# Patient Record
Sex: Female | Born: 1975
Health system: Southern US, Community
[De-identification: ages and names within clinical notes are randomized; demographics above are authoritative.]

## PROBLEM LIST (undated history)

## (undated) DIAGNOSIS — J45909 Unspecified asthma, uncomplicated: Secondary | ICD-10-CM

## (undated) DIAGNOSIS — J40 Bronchitis, not specified as acute or chronic: Secondary | ICD-10-CM

## (undated) DIAGNOSIS — J302 Other seasonal allergic rhinitis: Secondary | ICD-10-CM

## (undated) DIAGNOSIS — K219 Gastro-esophageal reflux disease without esophagitis: Secondary | ICD-10-CM

## (undated) DIAGNOSIS — J069 Acute upper respiratory infection, unspecified: Secondary | ICD-10-CM

## (undated) DIAGNOSIS — R87619 Unspecified abnormal cytological findings in specimens from cervix uteri: Secondary | ICD-10-CM

## (undated) HISTORY — DX: Gastro-esophageal reflux disease without esophagitis: K21.9

## (undated) HISTORY — DX: Unspecified abnormal cytological findings in specimens from cervix uteri: R87.619

## (undated) HISTORY — DX: Other seasonal allergic rhinitis: J30.2

## (undated) HISTORY — PX: WISDOM TOOTH EXTRACTION: SHX21

## (undated) HISTORY — DX: Acute upper respiratory infection, unspecified: J06.9

## (undated) HISTORY — DX: Unspecified asthma, uncomplicated: J45.909

---

## 1994-09-06 HISTORY — PX: BREAST REDUCTION SURGERY: SHX8

## 2003-06-14 ENCOUNTER — Other Ambulatory Visit: Admission: RE | Admit: 2003-06-14 | Discharge: 2003-06-14 | Payer: Self-pay | Admitting: *Deleted

## 2003-06-14 ENCOUNTER — Other Ambulatory Visit: Admission: RE | Admit: 2003-06-14 | Discharge: 2003-06-14 | Payer: Self-pay | Admitting: Obstetrics & Gynecology

## 2006-12-19 DIAGNOSIS — M549 Dorsalgia, unspecified: Secondary | ICD-10-CM | POA: Insufficient documentation

## 2007-01-23 DIAGNOSIS — R070 Pain in throat: Secondary | ICD-10-CM | POA: Insufficient documentation

## 2008-08-23 DIAGNOSIS — J020 Streptococcal pharyngitis: Secondary | ICD-10-CM | POA: Insufficient documentation

## 2008-08-23 DIAGNOSIS — B359 Dermatophytosis, unspecified: Secondary | ICD-10-CM | POA: Insufficient documentation

## 2008-08-23 DIAGNOSIS — Z9889 Other specified postprocedural states: Secondary | ICD-10-CM | POA: Insufficient documentation

## 2008-08-23 HISTORY — DX: Other specified postprocedural states: Z98.890

## 2013-02-10 DIAGNOSIS — R509 Fever, unspecified: Secondary | ICD-10-CM | POA: Insufficient documentation

## 2013-02-10 DIAGNOSIS — J029 Acute pharyngitis, unspecified: Secondary | ICD-10-CM | POA: Insufficient documentation

## 2013-06-13 DIAGNOSIS — R42 Dizziness and giddiness: Secondary | ICD-10-CM | POA: Insufficient documentation

## 2019-08-29 DIAGNOSIS — Z20828 Contact with and (suspected) exposure to other viral communicable diseases: Secondary | ICD-10-CM | POA: Diagnosis not present

## 2019-08-29 DIAGNOSIS — Z03818 Encounter for observation for suspected exposure to other biological agents ruled out: Secondary | ICD-10-CM | POA: Diagnosis not present

## 2019-09-05 DIAGNOSIS — J209 Acute bronchitis, unspecified: Secondary | ICD-10-CM | POA: Diagnosis not present

## 2019-09-05 DIAGNOSIS — R05 Cough: Secondary | ICD-10-CM | POA: Diagnosis not present

## 2019-09-05 DIAGNOSIS — Z20828 Contact with and (suspected) exposure to other viral communicable diseases: Secondary | ICD-10-CM | POA: Diagnosis not present

## 2019-09-29 DIAGNOSIS — Z20828 Contact with and (suspected) exposure to other viral communicable diseases: Secondary | ICD-10-CM | POA: Diagnosis not present

## 2019-10-12 ENCOUNTER — Encounter (HOSPITAL_BASED_OUTPATIENT_CLINIC_OR_DEPARTMENT_OTHER): Payer: Self-pay | Admitting: *Deleted

## 2019-10-12 ENCOUNTER — Emergency Department (HOSPITAL_BASED_OUTPATIENT_CLINIC_OR_DEPARTMENT_OTHER): Payer: BC Managed Care – PPO

## 2019-10-12 ENCOUNTER — Other Ambulatory Visit: Payer: Self-pay

## 2019-10-12 ENCOUNTER — Emergency Department (HOSPITAL_BASED_OUTPATIENT_CLINIC_OR_DEPARTMENT_OTHER)
Admission: EM | Admit: 2019-10-12 | Discharge: 2019-10-12 | Disposition: A | Discharge status: Home / Self Care | Payer: BC Managed Care – PPO | Attending: Emergency Medicine | Admitting: Emergency Medicine

## 2019-10-12 DIAGNOSIS — R05 Cough: Secondary | ICD-10-CM | POA: Diagnosis not present

## 2019-10-12 DIAGNOSIS — R0602 Shortness of breath: Secondary | ICD-10-CM | POA: Diagnosis not present

## 2019-10-12 DIAGNOSIS — Z87891 Personal history of nicotine dependence: Secondary | ICD-10-CM | POA: Diagnosis not present

## 2019-10-12 DIAGNOSIS — U071 COVID-19: Secondary | ICD-10-CM | POA: Insufficient documentation

## 2019-10-12 DIAGNOSIS — Z8616 Personal history of COVID-19: Secondary | ICD-10-CM | POA: Diagnosis not present

## 2019-10-12 DIAGNOSIS — R059 Cough, unspecified: Secondary | ICD-10-CM

## 2019-10-12 HISTORY — DX: Bronchitis, not specified as acute or chronic: J40

## 2019-10-12 MED ORDER — BENZONATATE 100 MG PO CAPS
200.0000 mg | ORAL_CAPSULE | Freq: Once | ORAL | Status: AC
  Administered 2019-10-12: 200 mg via ORAL
  Filled 2019-10-12: qty 2

## 2019-10-12 MED ORDER — BENZONATATE 100 MG PO CAPS: 100.0000 mg | ORAL_CAPSULE | Freq: Three times a day (TID) | ORAL | 0 refills | Status: DC

## 2019-10-12 NOTE — ED Provider Notes (Signed)
[place] EMERGENCY DEPARTMENT Provider Note   CSN: [id number] Arrival date & time: 10/12/19  [id number]     History Chief Complaint  Patient presents with  . Cough  . Shortness of Breath    Lindsay Mays is a 44 y.o. female.  44 yo F with chief complaints of cough.  Cough is gotten much more intense over the past 48 hours.  Patient was diagnosed with the novel coronavirus about 2 weeks ago.  Had been slightly improving and then had worsening cough.  Entire family was sick with this illness.  They are all feeling much better.  Patient had bronchitis a month before she was diagnosed with coronavirus.  Denies production of cough.  Denies leg swelling.  Denies vomiting or diarrhea.  Denies ongoing fevers.  Has had some chest pain with coughing.  The history is provided by the patient.  Illness Severity:  Moderate Onset quality:  Gradual Duration:  2 days Timing:  Constant Progression:  Worsening Chronicity:  New Associated symptoms: cough   Associated symptoms: no chest pain, no congestion, no fever, no headaches, no myalgias, no nausea, no rhinorrhea, no shortness of breath, no vomiting and no wheezing        Past Medical History:  Diagnosis Date  . Bronchitis     There are no problems to display for this patient.   History reviewed. No pertinent surgical history.   OB History    Gravida  4   Para  2   Term      Preterm      AB  2   Living        SAB      TAB      Ectopic      Multiple      Live Births              Family History  Problem Relation Age of Onset  . Cancer Father     Social History   Tobacco Use  . Smoking status: Former Research scientist (life sciences)  . Smokeless tobacco: Never Used  . Tobacco comment: quit 10 years ago  Substance Use Topics  . Alcohol use: Yes    Comment: occasionally  . Drug use: Never    Home Medications Prior to Admission medications   Medication Sig Start Date End Date Taking? Authorizing Provider    benzonatate (TESSALON) 100 MG capsule Take 1 capsule (100 mg total) by mouth every 8 (eight) hours. 10/12/19   Deno Etienne, DO    Allergies    Patient has no known allergies.  Review of Systems   Review of Systems  Constitutional: Negative for chills and fever.  HENT: Negative for congestion and rhinorrhea.   Eyes: Negative for redness and visual disturbance.  Respiratory: Positive for cough. Negative for shortness of breath and wheezing.   Cardiovascular: Negative for chest pain and palpitations.  Gastrointestinal: Negative for nausea and vomiting.  Genitourinary: Negative for dysuria and urgency.  Musculoskeletal: Negative for arthralgias and myalgias.  Skin: Negative for pallor and wound.  Neurological: Negative for dizziness and headaches.    Physical Exam Updated Vital Signs BP 123/87 (BP Location: Right Arm)   Pulse 77   Temp 98.1 F (36.7 C) (Oral)   Resp 18   Ht 5\' 5"  (1.651 m)   Wt 67.1 kg   SpO2 98%   BMI 24.63 kg/m   Physical Exam Vitals and nursing note reviewed.  Constitutional:      General: She is  not in acute distress.    Appearance: She is well-developed. She is not diaphoretic.  HENT:     Head: Normocephalic and atraumatic.  Eyes:     Pupils: Pupils are equal, round, and reactive to light.  Cardiovascular:     Rate and Rhythm: Normal rate and regular rhythm.     Heart sounds: No murmur. No friction rub. No gallop.   Pulmonary:     Effort: Pulmonary effort is normal. No tachypnea.     Breath sounds: No wheezing, rhonchi or rales.  Abdominal:     General: There is no distension.     Palpations: Abdomen is soft.     Tenderness: There is no abdominal tenderness.  Musculoskeletal:        General: No tenderness.     Cervical back: Normal range of motion and neck supple.  Skin:    General: Skin is warm and dry.  Neurological:     Mental Status: She is alert and oriented to person, place, and time.  Psychiatric:        Behavior: Behavior normal.      ED Results / Procedures / Treatments   Labs (all labs ordered are listed, but only abnormal results are displayed) Labs Reviewed - No data to display  EKG None  Radiology DG Chest [facility] 1 View  Result Date: 10/12/2019 CLINICAL DATA:  Cough and shortness of breath, recent history of COVID-19 EXAM: PORTABLE CHEST 1 VIEW COMPARISON:  None. FINDINGS: The heart size and mediastinal contours are within normal limits. Both lungs are clear. The visualized skeletal structures are unremarkable. IMPRESSION: No active disease. Electronically Signed   By: Randa Ngo M.D.   On: 10/12/2019 09:32    Procedures Procedures (including critical care time)  Medications Ordered in ED Medications  benzonatate (TESSALON) capsule 200 mg (200 mg Oral Given 10/12/19 [id number])    ED Course  I have reviewed the triage vital signs and the nursing notes.  Pertinent labs & imaging results that were available during my care of the patient were reviewed by me and considered in my medical decision making (see chart for details).    MDM Rules/Calculators/A&P                      44 yo F with a chief complaints of cough.  Occurring couple weeks after her initial diagnosis of the novel coronavirus.  She is well-appearing nontoxic.  Clear lung sounds on my exam.  She is able to ambulate without hypoxia.  Chest x-ray with out focal infiltrate or pneumothorax.  Will start on cough medicine.  PCP follow-up.  9:41 AM:  I have discussed the diagnosis/risks/treatment options with the patient and believe the pt to be eligible for discharge home to follow-up with PCP. We also discussed returning to the ED immediately if new or worsening sx occur. We discussed the sx which are most concerning (e.g., sudden worsening pain, fever, inability to tolerate by mouth) that necessitate immediate return. Medications administered to the patient during their visit and any new prescriptions provided to the patient are listed  below.  Medications given during this visit Medications  benzonatate (TESSALON) capsule 200 mg (200 mg Oral Given 10/12/19 0914)     The patient appears reasonably screen and/or stabilized for discharge and I doubt any other medical condition or other [facility] requiring further screening, evaluation, or treatment in the ED at this time prior to discharge.   Final Clinical Impression(s) / ED Diagnoses Final  diagnoses:  COVID-19  Cough    Rx / DC Orders ED Discharge Orders         Ordered    benzonatate (TESSALON) 100 MG capsule  Every 8 hours     10/12/19 [place], [place], DO 10/12/19 [phone]

## 2019-10-12 NOTE — Discharge Instructions (Signed)
Your CXR was without concern for bacterial pneumonia.    Take the cough medicine as prescribed.  Unfortunately cough is a reflex, the medicine will note stop it but hopefully will make it more tolerable.  Please return for worsening shortness of breath. Follow up with your family doctor.

## 2019-10-12 NOTE — ED Triage Notes (Signed)
Covid + on the 23rd of January.  Shortness of breath in the last 2 days. Denies fever.

## 2019-10-12 NOTE — ED Notes (Signed)
Ambulated in hall.  Pulse ox remained between 92-96%.  Tolerated well.

## 2019-10-27 DIAGNOSIS — J209 Acute bronchitis, unspecified: Secondary | ICD-10-CM | POA: Diagnosis not present

## 2019-10-27 DIAGNOSIS — R062 Wheezing: Secondary | ICD-10-CM | POA: Diagnosis not present

## 2019-11-28 DIAGNOSIS — R05 Cough: Secondary | ICD-10-CM | POA: Diagnosis not present

## 2019-12-20 DIAGNOSIS — K219 Gastro-esophageal reflux disease without esophagitis: Secondary | ICD-10-CM | POA: Diagnosis not present

## 2019-12-20 DIAGNOSIS — J4541 Moderate persistent asthma with (acute) exacerbation: Secondary | ICD-10-CM | POA: Diagnosis not present

## 2019-12-20 DIAGNOSIS — R05 Cough: Secondary | ICD-10-CM | POA: Diagnosis not present

## 2019-12-20 DIAGNOSIS — R053 Chronic cough: Secondary | ICD-10-CM | POA: Insufficient documentation

## 2019-12-20 DIAGNOSIS — R062 Wheezing: Secondary | ICD-10-CM | POA: Insufficient documentation

## 2019-12-20 DIAGNOSIS — J309 Allergic rhinitis, unspecified: Secondary | ICD-10-CM | POA: Diagnosis not present

## 2019-12-20 DIAGNOSIS — U099 Post covid-19 condition, unspecified: Secondary | ICD-10-CM

## 2019-12-20 DIAGNOSIS — Z87891 Personal history of nicotine dependence: Secondary | ICD-10-CM | POA: Diagnosis not present

## 2019-12-20 HISTORY — DX: Chronic cough: R05.3

## 2019-12-20 HISTORY — DX: Post covid-19 condition, unspecified: U09.9

## 2020-01-23 DIAGNOSIS — R05 Cough: Secondary | ICD-10-CM | POA: Diagnosis not present

## 2020-01-23 DIAGNOSIS — R062 Wheezing: Secondary | ICD-10-CM | POA: Diagnosis not present

## 2020-01-25 DIAGNOSIS — J019 Acute sinusitis, unspecified: Secondary | ICD-10-CM | POA: Diagnosis not present

## 2020-01-31 DIAGNOSIS — R05 Cough: Secondary | ICD-10-CM | POA: Diagnosis not present

## 2020-01-31 DIAGNOSIS — J4541 Moderate persistent asthma with (acute) exacerbation: Secondary | ICD-10-CM | POA: Diagnosis not present

## 2020-01-31 DIAGNOSIS — R062 Wheezing: Secondary | ICD-10-CM | POA: Diagnosis not present

## 2020-03-25 DIAGNOSIS — Z1231 Encounter for screening mammogram for malignant neoplasm of breast: Secondary | ICD-10-CM | POA: Diagnosis not present

## 2020-04-05 DIAGNOSIS — H6692 Otitis media, unspecified, left ear: Secondary | ICD-10-CM | POA: Diagnosis not present

## 2020-04-05 DIAGNOSIS — J019 Acute sinusitis, unspecified: Secondary | ICD-10-CM | POA: Diagnosis not present

## 2020-04-05 DIAGNOSIS — J029 Acute pharyngitis, unspecified: Secondary | ICD-10-CM | POA: Diagnosis not present

## 2020-05-13 ENCOUNTER — Other Ambulatory Visit: Payer: Self-pay

## 2020-05-13 ENCOUNTER — Encounter: Payer: Self-pay | Admitting: Allergy

## 2020-05-13 ENCOUNTER — Ambulatory Visit (INDEPENDENT_AMBULATORY_CARE_PROVIDER_SITE_OTHER): Payer: BC Managed Care – PPO | Admitting: Allergy

## 2020-05-13 VITALS — BP 110/70 | HR 80 | Temp 97.8°F | Resp 16 | Ht 65.0 in | Wt 154.1 lb

## 2020-05-13 DIAGNOSIS — H1013 Acute atopic conjunctivitis, bilateral: Secondary | ICD-10-CM

## 2020-05-13 DIAGNOSIS — J4540 Moderate persistent asthma, uncomplicated: Secondary | ICD-10-CM | POA: Diagnosis not present

## 2020-05-13 DIAGNOSIS — J3089 Other allergic rhinitis: Secondary | ICD-10-CM | POA: Diagnosis not present

## 2020-05-13 MED ORDER — BREO ELLIPTA 100-25 MCG/INH IN AEPB: 1.0000 | INHALATION_SPRAY | Freq: Every day | RESPIRATORY_TRACT | 5 refills | Status: DC

## 2020-05-13 MED ORDER — AZELASTINE-FLUTICASONE 137-50 MCG/ACT NA SUSP: 1.0000 | Freq: Two times a day (BID) | NASAL | 5 refills | Status: DC

## 2020-05-13 MED ORDER — MONTELUKAST SODIUM 10 MG PO TABS: 10.0000 mg | ORAL_TABLET | Freq: Every day | ORAL | 5 refills | Status: DC

## 2020-05-13 MED ORDER — ALBUTEROL SULFATE HFA 108 (90 BASE) MCG/ACT IN AERS: 2.0000 | INHALATION_SPRAY | RESPIRATORY_TRACT | 1 refills | Status: DC | PRN

## 2020-05-13 NOTE — Assessment & Plan Note (Signed)
   See assessment and plan as above for allergic rhinitis.  

## 2020-05-13 NOTE — Progress Notes (Signed)
New Patient Note  RE: Lindsay Mays MRN: [id number] DOB: March 01, 1976 Date of Office Visit: 05/13/2020  Referring provider: No ref. provider found Primary care provider: Patient, No Pcp Per  Chief Complaint: Asthma and Allergic Rhinitis   History of Present Illness: I had the pleasure of seeing Lindsay Mays for initial evaluation at the [facility] [place] [facility] [place] on 05/13/2020. She is a 44 y.o. female, who is self-referred here for the evaluation of asthma and allergic rhinitis.  Patient had COVID-19 in January 2021 and developed a cough afterwards. She saw pulmonology and was diagnosed with asthma.  She reports symptoms of chest tightness, shortness of breath, coughing, wheezing for <1 years. Current medications include Breo 124mcg 1 puff QD and albuterol prn which help. She reports not using aerochamber with inhalers. She tried the following inhalers: none. Main triggers are unknown. In the last month, frequency of symptoms: few times per week. Frequency of nocturnal symptoms: 0x/month. Frequency of SABA use: few times per week. Interference with physical activity: no. Sleep is undisturbed. In the last 12 months, emergency room visits/urgent care visits/doctor office visits or hospitalizations due to respiratory issues: 1 to ER. In the last 12 months, oral steroids courses: 4. Lifetime history of hospitalization for respiratory issues: no. Prior intubations: no. History of pneumonia: no. She was evaluated by pulmonologist in the past. Smoking exposure: no. Up to date with flu vaccine: yes. Up to date with COVID-19 vaccine: yes.  History of reflux: yes and takes Prilosec as needed.   10/12/2019 CXR: "IMPRESSION: No active disease."  She reports symptoms of nasal congestion, rhinorrhea, sneezing, itchy/watery eyes. Symptoms have been going on for 10 years but worse since May. The symptoms are present mainly during the spring and fall.  Other triggers include exposure to no. Anosmia: yes.  Headache: yes. She has used zyrtec and Flonase 2 sprays QD with minimal improvement in symptoms. Sinus infections: 1 and treated with Augmentin x 10 days and prednisone with some benefit. Previous work up includes: none. Previous ENT evaluation: no. Previous sinus imaging: no. History of nasal polyps: no. Last eye exam: 2021.  Assessment and Plan: Lindsay Mays is a 44 y.o. female with: Other allergic rhinitis Rhinoconjunctivitis symptoms for many years mainly in the spring and fall but now worsening since May.  Tried Zyrtec and Flonase with minimal benefit.  Treated with Augmentin and prednisone with some benefit.  No prior allergy/ENT evaluation.  Today's skin testing showed: Positive to grass pollen only.   Start environmental control measures as below.  May use over the counter antihistamines such as Zyrtec (cetirizine), Claritin (loratadine), Allegra (fexofenadine), or Xyzal (levocetirizine) daily as needed. May take twice a day if needed. Start Singulair (montelukast) 10mg  daily at night. Cautioned that in some children/adults can experience behavioral changes including hyperactivity, agitation, depression, sleep disturbances and suicidal ideations. These side effects are rare, but if you notice them you should notify me and discontinue Singulair (montelukast). Start dymista (fluticasone + azelastine nasal spray combination) 1 spray per nostril twice a day. This replaces Flonase (fluticasone) for now. If it's not covered let us know.   Nasal saline spray (i.e., Simply Saline) or nasal saline lavage (i.e., NeilMed) is recommended as needed and prior to medicated nasal sprays.  If no benefit with above regimen, will refer to ENT next.   Allergic conjunctivitis of both eyes  See assessment and plan as above for allergic rhinitis.  Moderate persistent asthma without complication Diagnosed with COVID-19 in January 2021 and  developed a persistent cough afterwards.  Evaluated by pulmonology and  diagnosed with asthma.  Currently on Breo 100 mcg 1 puff once a day and using albuterol few times per week with good benefit.  Patient takes Prilosec as needed for reflux.  Up-to-date with COVID-19 vaccination.  Today's spirometry showed some restriction. . Daily controller medication(s): continue Breo 116mcg 1 puff once a day and rinse mouth after each use.  . May use albuterol rescue inhaler 2 puffs every 4 to 6 hours as needed for shortness of breath, chest tightness, coughing, and wheezing. May use albuterol rescue inhaler 2 puffs 5 to 15 minutes prior to strenuous physical activities. Monitor frequency of use.   Return in about 2 months (around 07/13/2020).  Meds ordered this encounter  Medications  . montelukast (SINGULAIR) 10 MG tablet    Sig: Take 1 tablet (10 mg total) by mouth at bedtime.    Dispense:  30 tablet    Refill:  5  . Azelastine-Fluticasone 137-50 MCG/ACT SUSP    Sig: Place 1 spray into the nose in the morning and at bedtime.    Dispense:  23 g    Refill:  5  . albuterol (VENTOLIN HFA) 108 (90 Base) MCG/ACT inhaler    Sig: Inhale 2 puffs into the lungs every 4 (four) hours as needed for wheezing or shortness of breath.    Dispense:  1 each    Refill:  1  . fluticasone furoate-vilanterol (BREO ELLIPTA) 100-25 MCG/INH AEPB    Sig: Inhale 1 puff into the lungs daily. Rinse mouth after each use.    Dispense:  60 each    Refill:  5   Other allergy screening: Food allergy: no Medication allergy: no Hymenoptera allergy: no Urticaria: no Eczema:no History of recurrent infections suggestive of immunodeficency: no  Diagnostics: Spirometry:  Tracings reviewed. Her effort: Good reproducible efforts. FVC: 2.95L FEV1: 2.62L, 88% predicted FEV1/FVC ratio: 89% Interpretation: Spirometry consistent with possible restrictive disease.  Please see scanned spirometry results for details.  Skin Testing: Environmental allergy panel. Positive test to: grass.  Results  discussed with patient/family.  Airborne Adult Perc - 05/13/20 0933    Time Antigen Placed 0933    Allergen Manufacturer Lindsay Mays    Location Back    Number of Test 59    Panel 1 Select    1. Control-Buffer 50% Glycerol Negative    2. Control-Histamine 1 mg/ml 2+    3. Albumin saline Negative    4. Lily Negative    5. [place] Negative    6. Johnson Negative    7. Mondamin Blue Negative    8. Meadow Fescue Negative    9. Perennial Rye Negative    10. Sweet Vernal Negative    11. Timothy Negative    12. Cocklebur Negative    13. Burweed Marshelder Negative    14. Ragweed, short Negative    15. Ragweed, Giant Negative    16. Plantain,  English Negative    17. Lamb's Quarters Negative    18. Sheep Sorrell Negative    19. Rough Pigweed Negative    20. Marsh Elder, Rough Negative    21. Mugwort, Common Negative    22. Ash mix Negative    23. Birch mix Negative    24. Beech American Negative    25. Box, Elder Negative    26. Cedar, red Negative    27. Cottonwood, [place] Negative    28. Elm mix Negative    29. Hickory Negative  30. Maple mix Negative    31. Oak, [place] mix Negative    32. Pecan Pollen Negative    33. Pine mix Negative    34. Sycamore Eastern Negative    35. St. [place], Black Pollen Negative    36. Alternaria alternata Negative    37. Cladosporium Herbarum Negative    38. Aspergillus mix Negative    39. Penicillium mix Negative    40. Bipolaris sorokiniana (Helminthosporium) Negative    41. Drechslera spicifera (Curvularia) Negative    42. Mucor plumbeus Negative    43. Fusarium moniliforme Negative    44. Aureobasidium pullulans (pullulara) Negative    45. Rhizopus oryzae Negative    46. Botrytis cinera Negative    47. Epicoccum nigrum Negative    48. Phoma betae Negative    49. Candida Albicans Negative    50. Trichophyton mentagrophytes Negative    51. Mite, D Farinae  5,000 AU/ml Negative    52. Mite, D Pteronyssinus  5,000 AU/ml Negative    53.  Cat Hair 10,000 BAU/ml Negative    54.  Dog Epithelia Negative    55. Mixed Feathers Negative    56. Horse Epithelia Negative    57. Cockroach, German Negative    58. Mouse Negative    59. Tobacco Leaf Negative          Intradermal - 05/13/20 0948    Time Antigen Placed 1610    Allergen Manufacturer Lindsay Mays    Location Arm    Number of Test 15    Intradermal Select    Control Negative    Guatemala 2+    Johnson 2+    7 Grass Negative    Ragweed mix Negative    Weed mix Negative    Tree mix Negative    Mold 1 Negative    Mold 2 Negative    Mold 3 Negative    Mold 4 Negative    Cat Negative    Dog Negative    Cockroach Negative    Mite mix Negative           Past Medical History: Patient Active Problem List   Diagnosis Date Noted  . Moderate persistent asthma without complication 96/12/5407  . Other allergic rhinitis 05/13/2020  . Allergic conjunctivitis of both eyes 05/13/2020   Past Medical History:  Diagnosis Date  . Asthma   . Bronchitis   . Recurrent upper respiratory infection (URI)    Past Surgical History: Past Surgical History:  Procedure Laterality Date  . BREAST REDUCTION SURGERY  1996  . CESAREAN SECTION  2007   Medication List:  Current Outpatient Medications  Medication Sig Dispense Refill  . albuterol (VENTOLIN HFA) 108 (90 Base) MCG/ACT inhaler Inhale 2 puffs into the lungs every 4 (four) hours as needed for wheezing or shortness of breath. 1 each 1  . B Complex Vitamins (VITAMIN B COMPLEX PO) Take by mouth.    . benzonatate (TESSALON) 100 MG capsule Take 1 capsule (100 mg total) by mouth every 8 (eight) hours. 21 capsule 0  . fluticasone furoate-vilanterol (BREO ELLIPTA) 100-25 MCG/INH AEPB Inhale 1 puff into the lungs daily. Rinse mouth after each use. 60 each 5  . levonorgestrel (MIRENA) 20 MCG/24HR IUD by Intrauterine route.    Marland Kitchen omeprazole (PRILOSEC) 40 MG capsule Take 40 mg by mouth as needed.    Marland Kitchen VITAMIN D, CHOLECALCIFEROL, PO Take by  mouth daily.    . Azelastine-Fluticasone 137-50 MCG/ACT SUSP Place 1 spray into the nose  in the morning and at bedtime. 23 g 5  . montelukast (SINGULAIR) 10 MG tablet Take 1 tablet (10 mg total) by mouth at bedtime. 30 tablet 5   No current facility-administered medications for this visit.   Allergies: No Known Allergies Social History: Social History   Socioeconomic History  . Marital status: Married    Spouse name: Not on file  . Number of children: Not on file  . Years of education: Not on file  . Highest education level: Not on file  Occupational History  . Not on file  Tobacco Use  . Smoking status: Former Research scientist (life sciences)  . Smokeless tobacco: Never Used  . Tobacco comment: quit 10 years ago  Vaping Use  . Vaping Use: Never used  Substance and Sexual Activity  . Alcohol use: Yes    Comment: occasionally  . Drug use: Never  . Sexual activity: Yes  Other Topics Concern  . Not on file  Social History Narrative  . Not on file   Social Determinants of Health   Financial Resource Strain:   . Difficulty of Paying Living Expenses: Not on file  Food Insecurity:   . Worried About Charity fundraiser in the Last Year: Not on file  . Ran Out of Food in the Last Year: Not on file  Transportation Needs:   . Lack of Transportation (Medical): Not on file  . Lack of Transportation (Non-Medical): Not on file  Physical Activity:   . Days of Exercise per Week: Not on file  . Minutes of Exercise per Session: Not on file  Stress:   . Feeling of Stress : Not on file  Social Connections:   . Frequency of Communication with Friends and Family: Not on file  . Frequency of Social Gatherings with Friends and Family: Not on file  . Attends Religious Services: Not on file  . Active Member of Clubs or Organizations: Not on file  . Attends Archivist Meetings: Not on file  . Marital Status: Not on file   Lives in a house. Smoking: denies Occupation: Product manager HistoryFreight forwarder in the house: no Charity fundraiser in the family room: no Carpet in the bedroom: yes Heating: electric Cooling: central Pet: yes 1 dog x 10 yrs  Family History: Family History  Problem Relation Age of Onset  . Cancer Father   . Allergic rhinitis Neg Hx   . Angioedema Neg Hx   . Asthma Neg Hx   . Atopy Neg Hx   . Eczema Neg Hx   . Immunodeficiency Neg Hx   . Urticaria Neg Hx    Review of Systems  Constitutional: Negative for appetite change, chills, fever and unexpected weight change.  HENT: Positive for congestion, rhinorrhea and sneezing.   Eyes: Positive for itching.  Respiratory: Positive for cough. Negative for chest tightness, shortness of breath and wheezing.   Cardiovascular: Negative for chest pain.  Gastrointestinal: Negative for abdominal pain.  Genitourinary: Negative for difficulty urinating.  Skin: Negative for rash.  Allergic/Immunologic: Positive for environmental allergies.  Neurological: Negative for headaches.   Objective: BP 110/70   Pulse 80   Temp 97.8 F (36.6 C) (Oral)   Resp 16   Ht 5\' 5"  (1.651 m)   Wt 154 lb 1.6 oz (69.9 kg)   SpO2 100%   BMI 25.64 kg/m  Body mass index is 25.64 kg/m. Physical Exam Vitals and nursing note reviewed.  Constitutional:      Appearance: Normal  appearance. She is well-developed.  HENT:     Head: Normocephalic and atraumatic.     Right Ear: Tympanic membrane and external ear normal.     Left Ear: Tympanic membrane and external ear normal.     Nose: Congestion present.     Comments: Left side turbinate hypertrophy    Mouth/Throat:     Mouth: Mucous membranes are moist.     Pharynx: Oropharynx is clear.  Eyes:     Conjunctiva/sclera: Conjunctivae normal.  Cardiovascular:     Rate and Rhythm: Normal rate and regular rhythm.     Heart sounds: Normal heart sounds. No murmur heard.  No friction rub. No gallop.   Pulmonary:     Effort: Pulmonary effort is normal.      Breath sounds: Normal breath sounds. No wheezing, rhonchi or rales.  Musculoskeletal:     Cervical back: Neck supple.  Skin:    General: Skin is warm.     Findings: No rash.  Neurological:     Mental Status: She is alert and oriented to person, place, and time.  Psychiatric:        Behavior: Behavior normal.    The plan was reviewed with the patient/family, and all questions/concerned were addressed.  It was my pleasure to see Lindsay Mays today and participate in her care. Please feel free to contact me with any questions or concerns.  Sincerely,  Rexene Alberts, DO Allergy & Immunology  [facility] office: [phone] [facility] office: [place] office: [phone]

## 2020-05-13 NOTE — Addendum Note (Signed)
Addended by: Orpah Greek D on: 05/13/2020 02:31 PM   Modules accepted: Orders

## 2020-05-13 NOTE — Addendum Note (Signed)
Addended by: Orpah Greek D on: 05/13/2020 05:24 PM   Modules accepted: Orders

## 2020-05-13 NOTE — Assessment & Plan Note (Signed)
Diagnosed with COVID-19 in January 2021 and developed a persistent cough afterwards.  Evaluated by pulmonology and diagnosed with asthma.  Currently on Breo 100 mcg 1 puff once a day and using albuterol few times per week with good benefit.  Patient takes Prilosec as needed for reflux.  Up-to-date with COVID-19 vaccination.  Today's spirometry showed some restriction. . Daily controller medication(s): continue Breo 144mcg 1 puff once a day and rinse mouth after each use.  . May use albuterol rescue inhaler 2 puffs every 4 to 6 hours as needed for shortness of breath, chest tightness, coughing, and wheezing. May use albuterol rescue inhaler 2 puffs 5 to 15 minutes prior to strenuous physical activities. Monitor frequency of use.

## 2020-05-13 NOTE — Assessment & Plan Note (Signed)
Rhinoconjunctivitis symptoms for many years mainly in the spring and fall but now worsening since May.  Tried Zyrtec and Flonase with minimal benefit.  Treated with Augmentin and prednisone with some benefit.  No prior allergy/ENT evaluation.  Today's skin testing showed: Positive to grass pollen only.   Start environmental control measures as below.  May use over the counter antihistamines such as Zyrtec (cetirizine), Claritin (loratadine), Allegra (fexofenadine), or Xyzal (levocetirizine) daily as needed. May take twice a day if needed. Start Singulair (montelukast) 10mg  daily at night. Cautioned that in some children/adults can experience behavioral changes including hyperactivity, agitation, depression, sleep disturbances and suicidal ideations. These side effects are rare, but if you notice them you should notify me and discontinue Singulair (montelukast). Start dymista (fluticasone + azelastine nasal spray combination) 1 spray per nostril twice a day. This replaces Flonase (fluticasone) for now. If it's not covered let us know.   Nasal saline spray (i.e., Simply Saline) or nasal saline lavage (i.e., NeilMed) is recommended as needed and prior to medicated nasal sprays.  If no benefit with above regimen, will refer to ENT next.

## 2020-05-13 NOTE — Patient Instructions (Addendum)
Asthma: . Daily controller medica tion(s): continue Breo 172mcg 1 puff once a day and rinse mouth after each use.  . May use albuterol rescue inhaler 2 puffs every 4 to 6 hours as needed for shortness of breath, chest tightness, coughing, and wheezing. May use albuterol rescue inhaler 2 puffs 5 to 15 minutes prior to strenuous physical activities. Monitor frequency of use.  . Asthma control goals:  o Full participation in all desired activities (may need albuterol before activity) o Albuterol use two times or less a week on average (not counting use with activity) o Cough interfering with sleep two times or less a month o Oral steroids no more than once a year o No hospitalizations  Today's skin testing showed: Positive to grass pollen only.   Environmental allergies  Start environmental control measures as below.  May use over the counter antihistamines such as Zyrtec (cetirizine), Claritin (loratadine), Allegra (fexofenadine), or Xyzal (levocetirizine) daily as needed. May take twice a day if needed. Start Singulair (montelukast) 10mg  daily at night. Cautioned that in some children/adults can experience behavioral changes including hyperactivity, agitation, depression, sleep disturbances and suicidal ideations. These side effects are rare, but if you notice them you should notify me and discontinue Singulair (montelukast). Start dymista (fluticasone + azelastine nasal spray combination) 1 spray per nostril twice a day. This replaces Flonase (fluticasone) for now. If it's not covered let us know.   Nasal saline spray (i.e., Simply Saline) or nasal saline lavage (i.e., NeilMed) is recommended as needed and prior to medicated nasal sprays.  Follow up in 2 months or sooner if needed.  If no improvement in 1 month then you may request referral for ENT as well.   Recommend establishing care with a PCP.   Reducing Pollen Exposure . Pollen seasons: trees (spring), grass (summer) and  ragweed/weeds (fall). Marland Kitchen Keep windows closed in your home and car to lower pollen exposure.  Susa Simmonds air conditioning in the bedroom and throughout the house if possible.  . Avoid going out in dry windy days - especially early morning. . Pollen counts are highest between 5 - 10 AM and on dry, hot and windy days.  . Save outside activities for late afternoon or after a heavy rain, when pollen levels are lower.  . Avoid mowing of grass if you have grass pollen allergy. Marland Kitchen Be aware that pollen can also be transported indoors on people and pets.  . Dry your clothes in an automatic dryer rather than hanging them outside where they might collect pollen.  . Rinse hair and eyes before bedtime.

## 2020-05-14 ENCOUNTER — Telehealth: Payer: Self-pay | Admitting: Allergy

## 2020-05-14 NOTE — Telephone Encounter (Signed)
PA has been approved through [employer] for generic Dymista. Called patient and left a detailed voicemail per DPR permission advising of approval.

## 2020-05-14 NOTE — Telephone Encounter (Signed)
Patient was seen yesterday, 05/13/20, by Dr. Maudie Mercury in [place]. She was prescribed Dymista. Insurance is requiring a Prior Authorization.

## 2020-05-14 NOTE — Telephone Encounter (Signed)
PA has been submitted through CoverMyMeds for generic Dymista and is currently waiting approval or denial.  Called patient to get Rx information. ID: [id number] [facility] # [id number] Grp # [place]

## 2020-05-14 NOTE — Telephone Encounter (Signed)
Will work on prior authorization for [employer].

## 2020-06-22 DIAGNOSIS — Z20822 Contact with and (suspected) exposure to covid-19: Secondary | ICD-10-CM | POA: Diagnosis not present

## 2020-06-22 DIAGNOSIS — J029 Acute pharyngitis, unspecified: Secondary | ICD-10-CM | POA: Diagnosis not present

## 2020-07-07 DIAGNOSIS — J0121 Acute recurrent ethmoidal sinusitis: Secondary | ICD-10-CM | POA: Insufficient documentation

## 2020-07-07 DIAGNOSIS — J343 Hypertrophy of nasal turbinates: Secondary | ICD-10-CM | POA: Diagnosis not present

## 2020-07-07 DIAGNOSIS — R0981 Nasal congestion: Secondary | ICD-10-CM | POA: Diagnosis not present

## 2020-08-04 ENCOUNTER — Telehealth: Payer: Self-pay

## 2020-08-04 MED ORDER — BREO ELLIPTA 100-25 MCG/INH IN AEPB: 1.0000 | INHALATION_SPRAY | Freq: Every day | RESPIRATORY_TRACT | 2 refills | Status: DC

## 2020-08-04 NOTE — Telephone Encounter (Signed)
Okay sent in rx.

## 2020-08-04 NOTE — Telephone Encounter (Signed)
Patient called to see if her [facility] can be switched to a 90 day supply due to insurance coverage cost?   [place], [place] - [id number]

## 2020-08-04 NOTE — Telephone Encounter (Signed)
Dr. Maudie Mercury are you okay with this?

## 2020-08-13 DIAGNOSIS — J329 Chronic sinusitis, unspecified: Secondary | ICD-10-CM | POA: Diagnosis not present

## 2020-08-13 DIAGNOSIS — J0121 Acute recurrent ethmoidal sinusitis: Secondary | ICD-10-CM | POA: Diagnosis not present

## 2020-08-13 DIAGNOSIS — J3489 Other specified disorders of nose and nasal sinuses: Secondary | ICD-10-CM | POA: Diagnosis not present

## 2020-08-18 DIAGNOSIS — J322 Chronic ethmoidal sinusitis: Secondary | ICD-10-CM | POA: Diagnosis not present

## 2020-08-18 DIAGNOSIS — R430 Anosmia: Secondary | ICD-10-CM | POA: Diagnosis not present

## 2020-08-18 DIAGNOSIS — J320 Chronic maxillary sinusitis: Secondary | ICD-10-CM | POA: Insufficient documentation

## 2020-08-18 DIAGNOSIS — R0981 Nasal congestion: Secondary | ICD-10-CM | POA: Diagnosis not present

## 2020-08-18 DIAGNOSIS — J323 Chronic sphenoidal sinusitis: Secondary | ICD-10-CM | POA: Insufficient documentation

## 2020-08-18 DIAGNOSIS — J343 Hypertrophy of nasal turbinates: Secondary | ICD-10-CM | POA: Diagnosis not present

## 2020-08-18 DIAGNOSIS — J342 Deviated nasal septum: Secondary | ICD-10-CM | POA: Diagnosis not present

## 2020-08-20 DIAGNOSIS — Z1151 Encounter for screening for human papillomavirus (HPV): Secondary | ICD-10-CM | POA: Diagnosis not present

## 2020-08-20 DIAGNOSIS — Z124 Encounter for screening for malignant neoplasm of cervix: Secondary | ICD-10-CM | POA: Diagnosis not present

## 2020-08-20 DIAGNOSIS — Z01419 Encounter for gynecological examination (general) (routine) without abnormal findings: Secondary | ICD-10-CM | POA: Diagnosis not present

## 2020-08-20 DIAGNOSIS — N898 Other specified noninflammatory disorders of vagina: Secondary | ICD-10-CM | POA: Diagnosis not present

## 2020-08-20 DIAGNOSIS — Z30431 Encounter for routine checking of intrauterine contraceptive device: Secondary | ICD-10-CM | POA: Diagnosis not present

## 2020-08-22 LAB — HM PAP SMEAR: HM Pap smear: NORMAL

## 2020-08-25 DIAGNOSIS — Z7951 Long term (current) use of inhaled steroids: Secondary | ICD-10-CM | POA: Diagnosis not present

## 2020-08-25 DIAGNOSIS — Z87891 Personal history of nicotine dependence: Secondary | ICD-10-CM | POA: Diagnosis not present

## 2020-08-25 DIAGNOSIS — Z79899 Other long term (current) drug therapy: Secondary | ICD-10-CM | POA: Diagnosis not present

## 2020-08-25 DIAGNOSIS — Z30433 Encounter for removal and reinsertion of intrauterine contraceptive device: Secondary | ICD-10-CM | POA: Diagnosis not present

## 2020-09-06 DIAGNOSIS — U071 COVID-19: Secondary | ICD-10-CM

## 2020-09-06 HISTORY — DX: COVID-19: U07.1

## 2020-10-02 DIAGNOSIS — J328 Other chronic sinusitis: Secondary | ICD-10-CM | POA: Diagnosis not present

## 2020-10-02 DIAGNOSIS — J324 Chronic pansinusitis: Secondary | ICD-10-CM | POA: Diagnosis not present

## 2020-10-02 DIAGNOSIS — J322 Chronic ethmoidal sinusitis: Secondary | ICD-10-CM | POA: Diagnosis not present

## 2020-10-02 DIAGNOSIS — J343 Hypertrophy of nasal turbinates: Secondary | ICD-10-CM | POA: Diagnosis not present

## 2020-10-02 DIAGNOSIS — J342 Deviated nasal septum: Secondary | ICD-10-CM | POA: Diagnosis not present

## 2020-10-02 DIAGNOSIS — Z87891 Personal history of nicotine dependence: Secondary | ICD-10-CM | POA: Diagnosis not present

## 2020-10-02 DIAGNOSIS — J320 Chronic maxillary sinusitis: Secondary | ICD-10-CM | POA: Diagnosis not present

## 2020-10-07 DIAGNOSIS — Z48813 Encounter for surgical aftercare following surgery on the respiratory system: Secondary | ICD-10-CM | POA: Diagnosis not present

## 2020-10-07 DIAGNOSIS — Z9889 Other specified postprocedural states: Secondary | ICD-10-CM | POA: Diagnosis not present

## 2020-10-07 DIAGNOSIS — J3489 Other specified disorders of nose and nasal sinuses: Secondary | ICD-10-CM | POA: Diagnosis not present

## 2020-10-28 DIAGNOSIS — Z9889 Other specified postprocedural states: Secondary | ICD-10-CM | POA: Diagnosis not present

## 2020-10-28 DIAGNOSIS — J3489 Other specified disorders of nose and nasal sinuses: Secondary | ICD-10-CM | POA: Diagnosis not present

## 2020-10-28 DIAGNOSIS — Z48813 Encounter for surgical aftercare following surgery on the respiratory system: Secondary | ICD-10-CM | POA: Diagnosis not present

## 2020-10-29 ENCOUNTER — Telehealth: Payer: Self-pay | Admitting: Allergy

## 2020-10-29 MED ORDER — BREO ELLIPTA 100-25 MCG/INH IN AEPB: 1.0000 | INHALATION_SPRAY | Freq: Every day | RESPIRATORY_TRACT | 0 refills | Status: DC

## 2020-10-29 MED ORDER — ALBUTEROL SULFATE HFA 108 (90 BASE) MCG/ACT IN AERS: 2.0000 | INHALATION_SPRAY | RESPIRATORY_TRACT | 0 refills | Status: DC | PRN

## 2020-10-29 NOTE — Telephone Encounter (Signed)
Patient called and need to have breo and albuterol inhaler called into [facility] [address] in [place]. 3 month supply. [phone].

## 2020-10-29 NOTE — Telephone Encounter (Signed)
Called and spoke to patient and notified her that we cold send in a courtesy refill du to her not being seen in office since last year. Patient scheduled an appointment fr tomorrow with Dr. Maudie Mercury. A courtesy refill has been sent in. Patient expressed understanding.

## 2020-10-30 ENCOUNTER — Other Ambulatory Visit: Payer: Self-pay

## 2020-10-30 ENCOUNTER — Ambulatory Visit (INDEPENDENT_AMBULATORY_CARE_PROVIDER_SITE_OTHER): Payer: BC Managed Care – PPO | Admitting: Allergy

## 2020-10-30 ENCOUNTER — Encounter: Payer: Self-pay | Admitting: Allergy

## 2020-10-30 VITALS — BP 100/72 | HR 75 | Temp 97.5°F | Resp 18 | Ht 64.96 in | Wt 154.0 lb

## 2020-10-30 DIAGNOSIS — J4540 Moderate persistent asthma, uncomplicated: Secondary | ICD-10-CM | POA: Diagnosis not present

## 2020-10-30 DIAGNOSIS — H1013 Acute atopic conjunctivitis, bilateral: Secondary | ICD-10-CM

## 2020-10-30 DIAGNOSIS — J3089 Other allergic rhinitis: Secondary | ICD-10-CM | POA: Diagnosis not present

## 2020-10-30 MED ORDER — BREO ELLIPTA 200-25 MCG/INH IN AEPB: 1.0000 | INHALATION_SPRAY | Freq: Every day | RESPIRATORY_TRACT | 1 refills | Status: DC

## 2020-10-30 MED ORDER — ALBUTEROL SULFATE HFA 108 (90 BASE) MCG/ACT IN AERS: 2.0000 | INHALATION_SPRAY | RESPIRATORY_TRACT | 0 refills | Status: DC | PRN

## 2020-10-30 MED ORDER — MONTELUKAST SODIUM 10 MG PO TABS: 10.0000 mg | ORAL_TABLET | Freq: Every day | ORAL | 3 refills | Status: DC

## 2020-10-30 NOTE — Assessment & Plan Note (Signed)
Past history - Diagnosed with COVID-19 in January 2021 and developed a persistent cough afterwards.  Evaluated by pulmonology and diagnosed with asthma. Up-to-date with COVID-19 vaccination. Interim history - Doing well up until 3 weeks and now having increased coughing/wheezing. Ran out Breo a few days ago as well.   ACT score 16.   Today's spirometry showed some restriction with no improvement in FEV1 post bronchodilator treatment. Clinically feeling better and wheezing resolved.   . Daily controller medication(s): INCREASE Breo to 280mcg 1 puff once a day and rinse mouth after each use. Sample given.  o Sent in 3 months supply. . May use albuterol rescue inhaler 2 puffs every 4 to 6 hours as needed for shortness of breath, chest tightness, coughing, and wheezing. May use albuterol rescue inhaler 2 puffs 5 to 15 minutes prior to strenuous physical activities. Monitor frequency of use.  . Repeat spirometry at next visit.

## 2020-10-30 NOTE — Assessment & Plan Note (Signed)
Past history - Rhinoconjunctivitis symptoms for many years mainly in the spring and fall but now worsening since May.  Tried Zyrtec and Flonase with minimal benefit.  Treated with Augmentin and prednisone with some benefit. 2021 skin testing showed: Positive to grass pollen only.  Interim history - had sinus surgery with deviated septum repair and ? Polyps.  Continue environmental control measures as below.  May use over the counter antihistamines such as Zyrtec (cetirizine), Claritin (loratadine), Allegra (fexofenadine), or Xyzal (levocetirizine) daily as needed. May take twice a day if needed. Restart Singulair (montelukast) 10mg  daily at night.  Nasal saline spray (i.e., Simply Saline) or nasal saline lavage (i.e., NeilMed) is recommended as needed and prior to medicated nasal sprays.  Continue nasal spray/rinse as per ENT.   Consider switch from nasal rinse to Xhance nasal spray in the future - handout given.

## 2020-10-30 NOTE — Patient Instructions (Addendum)
Asthma: . Daily controller medica tion(s): INCREASE Breo to 258mcg 1 puff once a day and rinse mouth after each use. Sample given.  o Sent in 3 months supply. . May use albuterol rescue inhaler 2 puffs every 4 to 6 hours as needed for shortness of breath, chest tightness, coughing, and wheezing. May use albuterol rescue inhaler 2 puffs 5 to 15 minutes prior to strenuous physical activities. Monitor frequency of use.  . Asthma control goals:  o Full participation in all desired activities (may need albuterol before activity) o Albuterol use two times or less a week on average (not counting use with activity) o Cough interfering with sleep two times or less a month o Oral steroids no more than once a year o No hospitalizations  Environmental allergies  2021 skin testing positive to grass pollen.   Continue environmental control measures as below.  May use over the counter antihistamines such as Zyrtec (cetirizine), Claritin (loratadine), Allegra (fexofenadine), or Xyzal (levocetirizine) daily as needed. May take twice a day if needed. Restart Singulair (montelukast) 10mg  daily at night.  Nasal saline spray (i.e., Simply Saline) or nasal saline lavage (i.e., NeilMed) is recommended as needed and prior to medicated nasal sprays.  Continue nasal spray/rinse as per ENT.   Consider switch from nasal rinse to Xhance nasal spray to prevent polyps in the future - handout given.  Read about Dupixent injections for polyps - handout given.  Follow up in 3 months or sooner if needed to check on asthma.   Recommend establishing care with a PCP.   Reducing Pollen Exposure . Pollen seasons: trees (spring), grass (summer) and ragweed/weeds (fall). Marland Kitchen Keep windows closed in your home and car to lower pollen exposure.  Susa Simmonds air conditioning in the bedroom and throughout the house if possible.  . Avoid going out in dry windy days - especially early morning. . Pollen counts are highest between 5 -  10 AM and on dry, hot and windy days.  . Save outside activities for late afternoon or after a heavy rain, when pollen levels are lower.  . Avoid mowing of grass if you have grass pollen allergy. Marland Kitchen Be aware that pollen can also be transported indoors on people and pets.  . Dry your clothes in an automatic dryer rather than hanging them outside where they might collect pollen.  . Rinse hair and eyes before bedtime.

## 2020-10-30 NOTE — Progress Notes (Signed)
Follow Up Note  RE: Lindsay Mays MRN: [id number] DOB: 07-29-1976 Date of Office Visit: 10/30/2020  Referring provider: No ref. provider found Primary care provider: Patient, No Pcp Per  Chief Complaint: Asthma  History of Present Illness: I had the pleasure of seeing Lindsay Mays for a follow up visit at the [facility] [place] [facility] [place] on 10/30/2020. She is a 45 y.o. female, who is being followed for allergic rhinoconjunctivitis and asthma. Her previous allergy office visit was on 05/13/2020 with Dr. Maudie Mercury. Today is a regular follow up visit.  Moderate persistent asthma  ACT score 16.   Patient as doing well but the last 3 weeks noticed some increased coughing. Currently taking Breo 13mcg 1 puff once a day and patient ran out of a few days ago.  Using albuterol 2 puffs once a day the past few weeks with good benefit.  Denies any ER/urgent care visits or prednisone use since the last visit. No issues with exertion.   Allergic rhino conjunctivitis  Patient had deviated septum repair and ? If she had polyps. Nasal congestion has improved.  Noticed some itchy eyes the past few days.  Currently taking Claritin daily.  Patient is not sure when she stopped Singulair and if it helped.   Patient currently using Pulmicort saline wash twice a day per ENT.  She stopped PPI and no worsening reflux/heartburn symptoms.   Assessment and Plan: Lindsay Mays is a 45 y.o. female with: Moderate persistent asthma without complication Past history - Diagnosed with COVID-19 in January 2021 and developed a persistent cough afterwards.  Evaluated by pulmonology and diagnosed with asthma. Up-to-date with COVID-19 vaccination. Interim history - Doing well up until 3 weeks and now having increased coughing/wheezing. Ran out Breo a few days ago as well.   ACT score 16.   Today's spirometry showed some restriction with no improvement in FEV1 post bronchodilator treatment. Clinically feeling better and  wheezing resolved.   . Daily controller medication(s): INCREASE Breo to 247mcg 1 puff once a day and rinse mouth after each use. Sample given.  o Sent in 3 months supply. . May use albuterol rescue inhaler 2 puffs every 4 to 6 hours as needed for shortness of breath, chest tightness, coughing, and wheezing. May use albuterol rescue inhaler 2 puffs 5 to 15 minutes prior to strenuous physical activities. Monitor frequency of use.  . Repeat spirometry at next visit.   Other allergic rhinitis Past history - Rhinoconjunctivitis symptoms for many years mainly in the spring and fall but now worsening since May.  Tried Zyrtec and Flonase with minimal benefit.  Treated with Augmentin and prednisone with some benefit. 2021 skin testing showed: Positive to grass pollen only.  Interim history - had sinus surgery with deviated septum repair and ? Polyps.  Continue environmental control measures as below.  May use over the counter antihistamines such as Zyrtec (cetirizine), Claritin (loratadine), Allegra (fexofenadine), or Xyzal (levocetirizine) daily as needed. May take twice a day if needed. Restart Singulair (montelukast) 10mg  daily at night.  Nasal saline spray (i.e., Simply Saline) or nasal saline lavage (i.e., NeilMed) is recommended as needed and prior to medicated nasal sprays.  Continue nasal spray/rinse as per ENT.   Consider switch from nasal rinse to Xhance nasal spray in the future - handout given.  Return in about 3 months (around 01/27/2021).  Meds ordered this encounter  Medications  . albuterol (VENTOLIN HFA) 108 (90 Base) MCG/ACT inhaler    Sig: Inhale 2 puffs  into the lungs every 4 (four) hours as needed for wheezing or shortness of breath (coughing fits).    Dispense:  54 g    Refill:  0  . montelukast (SINGULAIR) 10 MG tablet    Sig: Take 1 tablet (10 mg total) by mouth at bedtime.    Dispense:  90 tablet    Refill:  3  . fluticasone furoate-vilanterol (BREO ELLIPTA) 200-25  MCG/INH AEPB    Sig: Inhale 1 puff into the lungs daily. Rinse mouth after each use.    Dispense:  180 each    Refill:  1   Lab Orders  No laboratory test(s) ordered today    Diagnostics: Spirometry:  Tracings reviewed. Her effort: Good reproducible efforts. FVC: 2.62L FEV1: 2.27L, 78% predicted FEV1/FVC ratio: 87% Interpretation: Spirometry consistent with possible restrictive disease with no improvement in FEV1 post bronchodilator treatment. Clinically feeling better and wheezing resolved.  Please see scanned spirometry results for details.  Medication List:  Current Outpatient Medications  Medication Sig Dispense Refill  . ascorbic acid (VITAMIN C) 1000 MG tablet Take by mouth.    . B Complex Vitamins (VITAMIN B COMPLEX PO) Take by mouth.    . cetirizine (ZYRTEC) 10 MG tablet Take by mouth.    . cyanocobalamin 1000 MCG tablet Take by mouth.    . fluticasone furoate-vilanterol (BREO ELLIPTA) 200-25 MCG/INH AEPB Inhale 1 puff into the lungs daily. Rinse mouth after each use. 180 each 1  . levonorgestrel (MIRENA) 20 MCG/24HR IUD by Intrauterine route.    Marland Kitchen omeprazole (PRILOSEC) 40 MG capsule Take 40 mg by mouth as needed.    . sodium chloride (OCEAN) 0.65 % nasal spray Place into the nose.    Marland Kitchen VITAMIN D, CHOLECALCIFEROL, PO Take by mouth daily.    Marland Kitchen albuterol (VENTOLIN HFA) 108 (90 Base) MCG/ACT inhaler Inhale 2 puffs into the lungs every 4 (four) hours as needed for wheezing or shortness of breath (coughing fits). 54 g 0  . montelukast (SINGULAIR) 10 MG tablet Take 1 tablet (10 mg total) by mouth at bedtime. 90 tablet 3   No current facility-administered medications for this visit.   Allergies: No Known Allergies I reviewed her past medical history, social history, family history, and environmental history and no significant changes have been reported from her previous visit.  Review of Systems  Constitutional: Negative for appetite change, chills, fever and unexpected  weight change.  HENT: Positive for congestion and rhinorrhea. Negative for sneezing.   Eyes: Positive for itching.  Respiratory: Positive for cough and wheezing. Negative for chest tightness and shortness of breath.   Cardiovascular: Negative for chest pain.  Gastrointestinal: Negative for abdominal pain.  Genitourinary: Negative for difficulty urinating.  Skin: Negative for rash.  Allergic/Immunologic: Positive for environmental allergies.  Neurological: Negative for headaches.   Objective: BP 100/72 (BP Location: Left Arm, Patient Position: Sitting, Cuff Size: Normal)   Pulse 75   Temp (!) 97.5 F (36.4 C) (Temporal)   Resp 18   Ht 5' 4.96" (1.65 m)   Wt 154 lb (69.9 kg)   SpO2 97%   BMI 25.66 kg/m  Body mass index is 25.66 kg/m. Physical Exam Vitals and nursing note reviewed.  Constitutional:      Appearance: Normal appearance. She is well-developed.  HENT:     Head: Normocephalic and atraumatic.     Right Ear: Tympanic membrane and external ear normal.     Left Ear: Tympanic membrane and external ear normal.  Nose: Nose normal.     Mouth/Throat:     Mouth: Mucous membranes are moist.     Pharynx: Oropharynx is clear.  Eyes:     Conjunctiva/sclera: Conjunctivae normal.  Cardiovascular:     Rate and Rhythm: Normal rate and regular rhythm.     Heart sounds: Normal heart sounds. No murmur heard. No friction rub. No gallop.   Pulmonary:     Effort: Pulmonary effort is normal.     Breath sounds: Wheezing present. No rhonchi or rales.  Musculoskeletal:     Cervical back: Neck supple.  Skin:    General: Skin is warm.     Findings: No rash.  Neurological:     Mental Status: She is alert and oriented to person, place, and time.  Psychiatric:        Behavior: Behavior normal.    Previous notes and tests were reviewed. The plan was reviewed with the patient/family, and all questions/concerned were addressed.  It was my pleasure to see Lindsay Mays today and participate  in her care. Please feel free to contact me with any questions or concerns.  Sincerely,  Rexene Alberts, DO Allergy & Immunology  [facility] office: [place] office: [phone]

## 2020-11-28 DIAGNOSIS — J189 Pneumonia, unspecified organism: Secondary | ICD-10-CM | POA: Diagnosis not present

## 2020-12-09 ENCOUNTER — Other Ambulatory Visit: Payer: Self-pay

## 2020-12-10 ENCOUNTER — Other Ambulatory Visit: Payer: Self-pay

## 2020-12-16 DIAGNOSIS — R059 Cough, unspecified: Secondary | ICD-10-CM | POA: Diagnosis not present

## 2020-12-16 DIAGNOSIS — J039 Acute tonsillitis, unspecified: Secondary | ICD-10-CM | POA: Diagnosis not present

## 2020-12-31 DIAGNOSIS — J189 Pneumonia, unspecified organism: Secondary | ICD-10-CM | POA: Diagnosis not present

## 2021-01-05 DIAGNOSIS — J322 Chronic ethmoidal sinusitis: Secondary | ICD-10-CM | POA: Diagnosis not present

## 2021-01-05 DIAGNOSIS — J320 Chronic maxillary sinusitis: Secondary | ICD-10-CM | POA: Diagnosis not present

## 2021-01-29 DIAGNOSIS — J309 Allergic rhinitis, unspecified: Secondary | ICD-10-CM | POA: Diagnosis not present

## 2021-01-29 DIAGNOSIS — Z87891 Personal history of nicotine dependence: Secondary | ICD-10-CM | POA: Diagnosis not present

## 2021-01-29 DIAGNOSIS — K219 Gastro-esophageal reflux disease without esophagitis: Secondary | ICD-10-CM | POA: Diagnosis not present

## 2021-01-29 DIAGNOSIS — J4541 Moderate persistent asthma with (acute) exacerbation: Secondary | ICD-10-CM | POA: Diagnosis not present

## 2021-02-04 NOTE — Progress Notes (Signed)
Follow Up Note  RE: Lindsay Mays MRN: [id number] DOB: Mar 17, 1976 Date of Office Visit: 02/05/2021  Referring provider: No ref. provider found Primary care provider: Patient, No Pcp Per (Inactive)  Chief Complaint: Asthma (After being back on Breo she's doing better not using inhaler as much)  History of Present Illness: I had the pleasure of seeing Lindsay Mays for a follow up visit at the [facility] [place] [facility] [place] on 02/05/2021. She is a 45 y.o. female, who is being followed for asthma, allergic rhinitis. Her previous allergy office visit was on 10/30/2020 with Dr. Maudie Mercury. Today is a regular follow up visit.  Moderate persistent asthma ACT score 13.   Patient ran out of Breo 29mcg for 1 month and had pneumonia in March/April - treated with prednisone and antibiotics. This caused her asthma to flare up. She also had thrush during this time.   Currently on Breo 245mcg 1 puff once a day and rinsing mouth after each use.  No rescue inhaler the past week and feeling much better the past week.   Only has a slight residual cough the past week. No wheezing, shortness of breath, fevers/chills.   Allergic rhinitis Currently taking OTC antihistamines daily and Singulair daily. Using budesonide saline wash once a day - going to see ENT - had polypectomy in January. Anosmia slowly improving.   Assessment and Plan: Lindsay Mays is a 45 y.o. female with: Moderate persistent asthma without complication Past history - Diagnosed with COVID-19 in January 2021 and developed a persistent cough afterwards.  Evaluated by pulmonology and diagnosed with asthma. Up-to-date with COVID-19 vaccination. Interim history - asthma flared during pneumonia and had 1 course of prednisone and 2 courses of antibiotics. Feeling almost back to baseline the past 1 week.  ACT score 13 - due to recent pneumonia.   Today's spirometry was normal.  Daily controller medication(s): continue Breo 266mcg 1 puff once a day and  rinse mouth after each use.   May use albuterol rescue inhaler 2 puffs every 4 to 6 hours as needed for shortness of breath, chest tightness, coughing, and wheezing. May use albuterol rescue inhaler 2 puffs 5 to 15 minutes prior to strenuous physical activities. Monitor frequency of use.  . Get spirometry at next visit.  Other allergic rhinitis Past history - Rhinoconjunctivitis symptoms for many years mainly in the spring and fall but now worsening since May.  2021 skin testing showed: Positive to grass pollen only. S/p polypectomy and deviated septum repair in 2022.  Interim history - stable.   Continue environmental control measures as below.  May use over the counter antihistamines such as Zyrtec (cetirizine), Claritin (loratadine), Allegra (fexofenadine), or Xyzal (levocetirizine) daily as needed. May take twice a day if needed.  Continue Singulair (montelukast) 10mg  daily at night.  Nasal saline spray (i.e., Simply Saline) or nasal saline lavage (i.e., NeilMed) is recommended as needed and prior to medicated nasal sprays.  Continue nasal spray/rinse as per ENT.  ? Consider switch from nasal rinse to Xhance nasal spray to prevent polyps in the future - handout given.  Allergic conjunctivitis of both eyes  See assessment and plan as above for allergic rhinitis.  Return in about 6 months (around 08/07/2021). Recommend establishing care with a PCP.  No orders of the defined types were placed in this encounter.  Lab Orders  No laboratory test(s) ordered today    Diagnostics: Spirometry:  Tracings reviewed. Her effort: Good reproducible efforts. FVC: 3.05L FEV1: 2.53L, 85% predicted  FEV1/FVC ratio: 83% Interpretation: Spirometry consistent with normal pattern.  Please see scanned spirometry results for details.  Medication List:  Current Outpatient Medications  Medication Sig Dispense Refill  . albuterol (VENTOLIN HFA) 108 (90 Base) MCG/ACT inhaler Inhale 2 puffs into the  lungs every 4 (four) hours as needed for wheezing or shortness of breath (coughing fits). 54 g 0  . cetirizine (ZYRTEC) 10 MG tablet Take by mouth.    . cyanocobalamin 1000 MCG tablet Take by mouth.    . EQ BUDESONIDE NASAL NA ADD 10ML (ONE DOSE) OF MEDICATION TO 250ML OF SALINE IN SINUS RINSE BOTTLE. IRRIGATE SINUSES WITH 120ML THROUGH EACH NOSTRIL TWICE DAILY    . fluticasone furoate-vilanterol (BREO ELLIPTA) 200-25 MCG/INH AEPB Inhale 1 puff into the lungs daily.    Marland Kitchen levonorgestrel (MIRENA) 20 MCG/24HR IUD by Intrauterine route.    . montelukast (SINGULAIR) 10 MG tablet Take 1 tablet (10 mg total) by mouth at bedtime. 90 tablet 3  . sodium chloride (OCEAN) 0.65 % nasal spray Place into the nose.     No current facility-administered medications for this visit.   Allergies: No Known Allergies I reviewed her past medical history, social history, family history, and environmental history and no significant changes have been reported from her previous visit.  Review of Systems  Constitutional: Negative for appetite change, chills, fever and unexpected weight change.  HENT: Negative for congestion, rhinorrhea and sneezing.   Eyes: Negative for itching.  Respiratory: Positive for cough. Negative for chest tightness, shortness of breath and wheezing.   Cardiovascular: Negative for chest pain.  Gastrointestinal: Negative for abdominal pain.  Genitourinary: Negative for difficulty urinating.  Skin: Negative for rash.  Allergic/Immunologic: Positive for environmental allergies.  Neurological: Negative for headaches.   Objective: BP 100/70 (BP Location: Left Arm, Patient Position: Sitting, Cuff Size: Normal)   Pulse 73   Temp (!) 97.4 F (36.3 C) (Temporal)   Resp 16   SpO2 97%  There is no height or weight on file to calculate BMI. Physical Exam Vitals and nursing note reviewed.  Constitutional:      Appearance: Normal appearance. She is well-developed.  HENT:     Head: Normocephalic  and atraumatic.     Right Ear: Tympanic membrane and external ear normal.     Left Ear: Tympanic membrane and external ear normal.     Nose: Nose normal.     Mouth/Throat:     Mouth: Mucous membranes are moist.     Pharynx: Oropharynx is clear.  Eyes:     Conjunctiva/sclera: Conjunctivae normal.  Cardiovascular:     Rate and Rhythm: Normal rate and regular rhythm.     Heart sounds: Normal heart sounds. No murmur heard.   Pulmonary:     Effort: Pulmonary effort is normal.     Breath sounds: Normal breath sounds. No wheezing, rhonchi or rales.  Musculoskeletal:     Cervical back: Neck supple.  Skin:    General: Skin is warm.     Findings: No rash.  Neurological:     Mental Status: She is alert and oriented to person, place, and time.  Psychiatric:        Behavior: Behavior normal.    Previous notes and tests were reviewed. The plan was reviewed with the patient/family, and all questions/concerned were addressed.  It was my pleasure to see Lindsay Mays today and participate in her care. Please feel free to contact me with any questions or concerns.  Sincerely,  Rexene Alberts,  DO Allergy & Immunology  [facility] office: [place] office: [phone]

## 2021-02-05 ENCOUNTER — Ambulatory Visit (INDEPENDENT_AMBULATORY_CARE_PROVIDER_SITE_OTHER): Payer: BC Managed Care – PPO | Admitting: Allergy

## 2021-02-05 ENCOUNTER — Other Ambulatory Visit: Payer: Self-pay

## 2021-02-05 ENCOUNTER — Encounter: Payer: Self-pay | Admitting: Allergy

## 2021-02-05 VITALS — BP 100/70 | HR 73 | Temp 97.4°F | Resp 16

## 2021-02-05 DIAGNOSIS — H1013 Acute atopic conjunctivitis, bilateral: Secondary | ICD-10-CM | POA: Diagnosis not present

## 2021-02-05 DIAGNOSIS — J4540 Moderate persistent asthma, uncomplicated: Secondary | ICD-10-CM | POA: Insufficient documentation

## 2021-02-05 DIAGNOSIS — J3089 Other allergic rhinitis: Secondary | ICD-10-CM

## 2021-02-05 NOTE — Assessment & Plan Note (Signed)
Past history - Rhinoconjunctivitis symptoms for many years mainly in the spring and fall but now worsening since May.  2021 skin testing showed: Positive to grass pollen only. S/p polypectomy and deviated septum repair in 2022.  Interim history - stable.   Continue environmental control measures as below.  May use over the counter antihistamines such as Zyrtec (cetirizine), Claritin (loratadine), Allegra (fexofenadine), or Xyzal (levocetirizine) daily as needed. May take twice a day if needed.  Continue Singulair (montelukast) 10mg  daily at night.  Nasal saline spray (i.e., Simply Saline) or nasal saline lavage (i.e., NeilMed) is recommended as needed and prior to medicated nasal sprays.  Continue nasal spray/rinse as per ENT.  ? Consider switch from nasal rinse to Xhance nasal spray to prevent polyps in the future - handout given.

## 2021-02-05 NOTE — Assessment & Plan Note (Signed)
   See assessment and plan as above for allergic rhinitis.  

## 2021-02-05 NOTE — Assessment & Plan Note (Signed)
Past history - Diagnosed with COVID-19 in January 2021 and developed a persistent cough afterwards.  Evaluated by pulmonology and diagnosed with asthma. Up-to-date with COVID-19 vaccination. Interim history - asthma flared during pneumonia and had 1 course of prednisone and 2 courses of antibiotics. Feeling almost back to baseline the past 1 week.  ACT score 13 - due to recent pneumonia.   Today's spirometry was normal.  Daily controller medication(s): continue Breo 278mcg 1 puff once a day and rinse mouth after each use.   May use albuterol rescue inhaler 2 puffs every 4 to 6 hours as needed for shortness of breath, chest tightness, coughing, and wheezing. May use albuterol rescue inhaler 2 puffs 5 to 15 minutes prior to strenuous physical activities. Monitor frequency of use.  . Get spirometry at next visit.

## 2021-02-05 NOTE — Patient Instructions (Addendum)
Asthma: . Daily controller medication(s): continue Breo 271mcg 1 puff once a day and rinse mouth after each use.  . May use albuterol rescue inhaler 2 puffs every 4 to 6 hours as needed for shortness of breath, chest tightness, coughing, and wheezing. May use albuterol rescue inhaler 2 puffs 5 to 15 minutes prior to strenuous physical activities. Monitor frequency of use.  . Asthma control goals:  o Full participation in all desired activities (may need albuterol before activity) o Albuterol use two times or less a week on average (not counting use with activity) o Cough interfering with sleep two times or less a month o Oral steroids no more than once a year o No hospitalizations  Environmental allergies  2021 skin testing positive to grass pollen.   Continue environmental control measures as below.  May use over the counter antihistamines such as Zyrtec (cetirizine), Claritin (loratadine), Allegra (fexofenadine), or Xyzal (levocetirizine) daily as needed. May take twice a day if needed. Continue Singulair (montelukast) 10mg  daily at night.  Nasal saline spray (i.e., Simply Saline) or nasal saline lavage (i.e., NeilMed) is recommended as needed and prior to medicated nasal sprays.  Continue nasal spray/rinse as per ENT.   Consider switch from nasal rinse to Xhance nasal spray to prevent polyps in the future - handout given.  Follow up in 6 months or sooner if needed.   Recommend establishing care with a PCP.   Reducing Pollen Exposure . Pollen seasons: trees (spring), grass (summer) and ragweed/weeds (fall). Marland Kitchen Keep windows closed in your home and car to lower pollen exposure.  Susa Simmonds air conditioning in the bedroom and throughout the house if possible.  . Avoid going out in dry windy days - especially early morning. . Pollen counts are highest between 5 - 10 AM and on dry, hot and windy days.  . Save outside activities for late afternoon or after a heavy rain, when pollen  levels are lower.  . Avoid mowing of grass if you have grass pollen allergy. Marland Kitchen Be aware that pollen can also be transported indoors on people and pets.  . Dry your clothes in an automatic dryer rather than hanging them outside where they might collect pollen.  . Rinse hair and eyes before bedtime.

## 2021-02-11 ENCOUNTER — Ambulatory Visit: Payer: BC Managed Care – PPO | Admitting: Family Medicine

## 2021-03-10 ENCOUNTER — Encounter: Payer: Self-pay | Admitting: Family Medicine

## 2021-03-10 ENCOUNTER — Ambulatory Visit (INDEPENDENT_AMBULATORY_CARE_PROVIDER_SITE_OTHER): Payer: BC Managed Care – PPO | Admitting: Family Medicine

## 2021-03-10 ENCOUNTER — Other Ambulatory Visit: Payer: Self-pay

## 2021-03-10 VITALS — BP 103/69 | HR 78 | Temp 97.4°F | Ht 64.75 in | Wt 153.0 lb

## 2021-03-10 DIAGNOSIS — R062 Wheezing: Secondary | ICD-10-CM | POA: Diagnosis not present

## 2021-03-10 DIAGNOSIS — Z8701 Personal history of pneumonia (recurrent): Secondary | ICD-10-CM | POA: Diagnosis not present

## 2021-03-10 DIAGNOSIS — B370 Candidal stomatitis: Secondary | ICD-10-CM

## 2021-03-10 DIAGNOSIS — Z0000 Encounter for general adult medical examination without abnormal findings: Secondary | ICD-10-CM

## 2021-03-10 DIAGNOSIS — B9689 Other specified bacterial agents as the cause of diseases classified elsewhere: Secondary | ICD-10-CM

## 2021-03-10 DIAGNOSIS — Z1211 Encounter for screening for malignant neoplasm of colon: Secondary | ICD-10-CM | POA: Diagnosis not present

## 2021-03-10 DIAGNOSIS — J329 Chronic sinusitis, unspecified: Secondary | ICD-10-CM

## 2021-03-10 MED ORDER — NYSTATIN 100000 UNIT/ML MT SUSP: 5.0000 mL | Freq: Four times a day (QID) | OROMUCOSAL | 0 refills | Status: DC

## 2021-03-10 MED ORDER — DOXYCYCLINE HYCLATE 100 MG PO TABS: 100.0000 mg | ORAL_TABLET | Freq: Two times a day (BID) | ORAL | 0 refills | Status: DC

## 2021-03-10 NOTE — Patient Instructions (Addendum)
Great to meet you today! Treat with doxycyline every 12 hours for 10 days.  Please stop at [place] to have chest xray.   Flonase nasal spray would be helpful on off days of the rinse.     [facility]  Address:  [address], [place], [place] [address]  Phone: [phone]

## 2021-03-10 NOTE — Progress Notes (Signed)
Patient ID: Lindsay Mays, female  DOB: Jul 29, 1976, 45 y.o.   MRN: [id number] Patient Care Team    Relationship Specialty Notifications Start End  Ma Hillock, DO PCP - General Family Medicine  03/10/21   Charlaine Dalton, MD Referring Physician   03/10/21   Malachi Pro, MD Referring Physician Pediatrics  03/10/21   Garnet Sierras, DO Consulting Physician Allergy  03/10/21   Sheldon Silvan., MD Referring Physician Obstetrics and Gynecology  03/10/21     Chief Complaint  Patient presents with   Establish Care   Nasal Congestion    Pt c/o nasal congestion, sinus pressure, x 2 weeks worsen in last 6 days;     Subjective:  SHIRLY Mays is a 45 y.o.  female present for new patient establishment-acute. All past medical history, surgical history, allergies, family history, immunizations, medications and social history were updated in the electronic medical record today. All recent labs, ED visits and hospitalizations within the last year were reviewed.  Nasal congestion: She reports she had a recent trip to [place] and has noticed increasing nasal congestion, sinus pressure over the last 2 weeks since this trip, worsening over the last 6 days.  She denies any fevers or chills.  She has had bilateral ear pressure and headache.  She states she had COVID-19 at the beginning of this year and since that time she has had many difficulties with upper respiratory infections, asthma, allergies and recently was treated for pneumonia. She is established with allergy and asthma and pulmonology. She reports compliance with her Breo inhaler, Zyrtec and Singulair.  Depression screen [facility] 2/9 03/10/2021  Decreased Interest 0  Down, Depressed, Hopeless 0  PHQ - 2 Score 0   No flowsheet data found.       No flowsheet data found.   Immunization History  Administered Date(s) Administered   Influenza Split 09/07/2011, 12/03/2012   Influenza-Unspecified 06/06/2020   Moderna  Sars-Covid-2 Vaccination 11/24/2019, 12/25/2019, 08/11/2020    No results found.  Past Medical History:  Diagnosis Date   Abnormal Papanicolaou smear of cervix    Asthma    Bronchitis    COVID-19 09/2020   GERD (gastroesophageal reflux disease)    H/O bilateral breast reduction surgery 08/23/2008   Formatting of this note might be different from the original. Overview:  1997 [place], [place] Formatting of this note might be different from the original. [place], [place]   Recurrent upper respiratory infection (URI)    Seasonal allergies    No Known Allergies Past Surgical History:  Procedure Laterality Date   BREAST REDUCTION SURGERY  1996   CESAREAN SECTION  2007   Family History  Problem Relation Age of Onset   Early death Father    Colon cancer Father 18       30s   Colitis Father    Allergic rhinitis Neg Hx    Angioedema Neg Hx    Asthma Neg Hx    Atopy Neg Hx    Eczema Neg Hx    Immunodeficiency Neg Hx    Urticaria Neg Hx    Social History   Social History Narrative   Marital status/children/pets: Married.  2 children   Education/employment: Bachelor's degree.  Works as a Tree surgeon.   Safety:      -smoke alarm in the home:Yes     - wears seatbelt: Yes     - Feels safe in their relationships: Yes  Allergies as of 03/10/2021   No Known Allergies      Medication List        Accurate as of March 10, 2021  3:32 PM. If you have any questions, ask your nurse or doctor.          STOP taking these medications    cyanocobalamin 1000 MCG tablet Stopped by: Howard Pouch, DO       TAKE these medications    albuterol 108 (90 Base) MCG/ACT inhaler Commonly known as: VENTOLIN HFA Inhale 2 puffs into the lungs every 4 (four) hours as needed for wheezing or shortness of breath (coughing fits).   Breo Ellipta 200-25 MCG/INH Aepb Generic drug: fluticasone furoate-vilanterol Inhale 1 puff into the lungs daily.   cetirizine 10 MG tablet Commonly  known as: ZYRTEC Take by mouth.   doxycycline 100 MG tablet Commonly known as: VIBRA-TABS Take 1 tablet (100 mg total) by mouth 2 (two) times daily. Started by: Howard Pouch, DO   EQ BUDESONIDE NASAL NA ADD 10ML (ONE DOSE) OF MEDICATION TO 250ML OF SALINE IN SINUS RINSE BOTTLE. IRRIGATE SINUSES WITH 120ML THROUGH EACH NOSTRIL TWICE DAILY   levonorgestrel 20 MCG/24HR IUD Commonly known as: MIRENA by Intrauterine route.   montelukast 10 MG tablet Commonly known as: Singulair Take 1 tablet (10 mg total) by mouth at bedtime.   nystatin 100000 UNIT/ML suspension Commonly known as: MYCOSTATIN Take 5 mLs (500,000 Units total) by mouth 4 (four) times daily. 5 ml swish for 30 seconds and then spit out, QID, do not swallow. Started by: Howard Pouch, DO   sodium chloride 0.65 % nasal spray Commonly known as: OCEAN Place into the nose.        All past medical history, surgical history, allergies, family history, immunizations andmedications were updated in the EMR today and reviewed under the history and medication portions of their EMR.    No results found for this or any previous visit (from the past 2160 hour(s)).  DG Chest Port 1 View  Result Date: 10/12/2019 CLINICAL DATA:  Cough and shortness of breath, recent history of COVID-19 EXAM: PORTABLE CHEST 1 VIEW COMPARISON:  None. FINDINGS: The heart size and mediastinal contours are within normal limits. Both lungs are clear. The visualized skeletal structures are unremarkable. IMPRESSION: No active disease. Electronically Signed   By: Randa Ngo M.D.   On: 10/12/2019 09:32    ROS: 14 pt review of systems performed and negative (unless mentioned in an HPI)  Objective: BP 103/69   Pulse 78   Temp (!) 97.4 F (36.3 C) (Oral)   Ht 5' 4.75" (1.645 m)   Wt 153 lb (69.4 kg)   SpO2 96%   BMI 25.66 kg/m  Gen: Afebrile. No acute distress. Nontoxic in appearance, well-developed, well-nourished, very pleasant female. HENT: AT. [place].  Bilateral TM visualized and normal in appearance-very small effusions bilateral, normal external auditory canal. MMM, no oral lesions, adequate dentition. Bilateral nares within normal limits. Throat without erythema, ulcerations.  Bilateral anterior tonsillar white plaques .  No cough or hoarseness on exam Eyes:Pupils Equal Round Reactive to light, Extraocular movements intact,  Conjunctiva without redness, discharge or icterus. Neck/lymp/endocrine: Supple, no lymphadenopathy CV: RRR no murmur, no edema Chest: Left lower lobe wheeze present.  Otherwise clear to auscultation.  Normal respiratory effort.  Good air movement. Skin: no rashes, purpura or petechiae. Warm and well-perfused. Skin intact. Neuro/Msk:Normal gait. PERLA. EOMi. Alert. Oriented x3.  Psych: Normal affect, dress and demeanor. Normal speech. Normal  thought content and judgment.   Assessment/plan: RASHAWNA SCOLES is a 45 y.o. female present for establish care Bacterial sinusitis/History of recent pneumonia/Wheezing Rest, hydrate.  +/- flonase, mucinex (DM if cough), nettie pot or nasal saline.  Doxycycline prescribed, take until completed.  If cough present it can last up to 6-8 weeks.  F/U 2 -4 weeks for CPE and recheck - DG Chest 2 View; Future  Colon cancer screening Father passed away before she was born, he was in his 52s of colon cancer.  She is overdue for colon cancer screening. - Ambulatory referral to Gastroenterology She was encouraged to schedule a physical discuss all other preventative health maintenance  thrush: Encouraged her to gargle after using Breo inhaler Return in about 4 weeks (around 04/07/2021) for CPE (30 min).  Orders Placed This Encounter  Procedures   DG Chest 2 View   Ambulatory referral to Gastroenterology    Meds ordered this encounter  Medications   nystatin (MYCOSTATIN) 100000 UNIT/ML suspension    Sig: Take 5 mLs (500,000 Units total) by mouth 4 (four) times daily. 5 ml swish for  30 seconds and then spit out, QID, do not swallow.    Dispense:  100 mL    Refill:  0   doxycycline (VIBRA-TABS) 100 MG tablet    Sig: Take 1 tablet (100 mg total) by mouth 2 (two) times daily.    Dispense:  20 tablet    Refill:  0    Referral Orders  Ambulatory referral to Gastroenterology     Note is dictated utilizing voice recognition software. Although note has been proof read prior to signing, occasional typographical errors still can be missed. If any questions arise, please do not hesitate to call for verification.  Electronically signed by: Howard Pouch, DO [place]

## 2021-03-12 ENCOUNTER — Ambulatory Visit (HOSPITAL_BASED_OUTPATIENT_CLINIC_OR_DEPARTMENT_OTHER)
Admission: RE | Admit: 2021-03-12 | Discharge: 2021-03-12 | Disposition: A | Discharge status: Home / Self Care | Payer: BC Managed Care – PPO | Source: Ambulatory Visit | Attending: Family Medicine | Admitting: Family Medicine

## 2021-03-12 ENCOUNTER — Other Ambulatory Visit: Payer: Self-pay

## 2021-03-12 DIAGNOSIS — R062 Wheezing: Secondary | ICD-10-CM

## 2021-03-12 DIAGNOSIS — R0989 Other specified symptoms and signs involving the circulatory and respiratory systems: Secondary | ICD-10-CM | POA: Diagnosis not present

## 2021-03-12 DIAGNOSIS — J189 Pneumonia, unspecified organism: Secondary | ICD-10-CM | POA: Diagnosis not present

## 2021-04-01 ENCOUNTER — Encounter: Payer: BC Managed Care – PPO | Admitting: Family Medicine

## 2021-05-04 ENCOUNTER — Ambulatory Visit (INDEPENDENT_AMBULATORY_CARE_PROVIDER_SITE_OTHER): Payer: BC Managed Care – PPO | Admitting: Family Medicine

## 2021-05-04 ENCOUNTER — Encounter: Payer: Self-pay | Admitting: Family Medicine

## 2021-05-04 ENCOUNTER — Other Ambulatory Visit: Payer: Self-pay

## 2021-05-04 VITALS — BP 109/74 | HR 78 | Temp 97.5°F | Ht 64.75 in | Wt 151.0 lb

## 2021-05-04 DIAGNOSIS — Z1231 Encounter for screening mammogram for malignant neoplasm of breast: Secondary | ICD-10-CM

## 2021-05-04 DIAGNOSIS — Z0000 Encounter for general adult medical examination without abnormal findings: Secondary | ICD-10-CM

## 2021-05-04 DIAGNOSIS — Z13220 Encounter for screening for lipoid disorders: Secondary | ICD-10-CM

## 2021-05-04 DIAGNOSIS — Z131 Encounter for screening for diabetes mellitus: Secondary | ICD-10-CM

## 2021-05-04 DIAGNOSIS — Z23 Encounter for immunization: Secondary | ICD-10-CM

## 2021-05-04 DIAGNOSIS — Z1159 Encounter for screening for other viral diseases: Secondary | ICD-10-CM

## 2021-05-04 DIAGNOSIS — Z79899 Other long term (current) drug therapy: Secondary | ICD-10-CM

## 2021-05-04 DIAGNOSIS — Z130 Encounter for screening for diseases of the blood and blood-forming organs and certain disorders involving the immune mechanism: Secondary | ICD-10-CM

## 2021-05-04 MED ORDER — PREDNISONE 50 MG PO TABS: 50.0000 mg | ORAL_TABLET | Freq: Every day | ORAL | 0 refills | Status: DC

## 2021-05-04 NOTE — Patient Instructions (Addendum)
[facility] Address:  [address], [place], [place] [address] Phone: [phone]  Great to see you today.  I have refilled the medication(s) we provide.   If labs were collected, we will inform you of lab results once received either by echart message or telephone call.   - echart message- for normal results that have been seen by the patient already.   - telephone call: abnormal results or if patient has not viewed results in their echart. Health Maintenance, Female Adopting a healthy lifestyle and getting preventive care are important in promoting health and wellness. Ask your health care provider about: The right schedule for you to have regular tests and exams. Things you can do on your own to prevent diseases and keep yourself healthy. What should I know about diet, weight, and exercise? Eat a healthy diet  Eat a diet that includes plenty of vegetables, fruits, low-fat dairy products, and lean protein. Do not eat a lot of foods that are high in solid fats, added sugars, or sodium.  Maintain a healthy weight Body mass index (BMI) is used to identify weight problems. It estimates body fat based on height and weight. Your health care provider can help determineyour BMI and help you achieve or maintain a healthy weight. Get regular exercise Get regular exercise. This is one of the most important things you can do for your health. Most adults should: Exercise for at least 150 minutes each week. The exercise should increase your heart rate and make you sweat (moderate-intensity exercise). Do strengthening exercises at least twice a week. This is in addition to the moderate-intensity exercise. Spend less time sitting. Even light physical activity can be beneficial. Watch cholesterol and blood lipids Have your blood tested for lipids and cholesterol at 47 years of age, then havethis test every 5 years. Have your cholesterol levels checked more often if: Your lipid or  cholesterol levels are high. You are older than 47 years of age. You are at high risk for heart disease. What should I know about cancer screening? Depending on your health history and family history, you may need to have cancer screening at various ages. This may include screening for: Breast cancer. Cervical cancer. Colorectal cancer. Skin cancer. Lung cancer. What should I know about heart disease, diabetes, and high blood pressure? Blood pressure and heart disease High blood pressure causes heart disease and increases the risk of stroke. This is more likely to develop in people who have high blood pressure readings, are of African descent, or are overweight. Have your blood pressure checked: Every 3-5 years if you are 53-74 years of age. Every year if you are 1 years old or older. Diabetes Have regular diabetes screenings. This checks your fasting blood sugar level. Have the screening done: Once every three years after age 72 if you are at a normal weight and have a low risk for diabetes. More often and at a younger age if you are overweight or have a high risk for diabetes. What should I know about preventing infection? Hepatitis B If you have a higher risk for hepatitis B, you should be screened for this virus. Talk with your health care provider to find out if you are at risk forhepatitis B infection. Hepatitis C Testing is recommended for: Everyone born from [birth year] through 1967. Anyone with known risk factors for hepatitis C. Sexually transmitted infections (STIs) Get screened for STIs, including gonorrhea and chlamydia, if: You are sexually active and are younger than 47  years of age. You are older than 47 years of age and your health care provider tells you that you are at risk for this type of infection. Your sexual activity has changed since you were last screened, and you are at increased risk for chlamydia or gonorrhea. Ask your health care provider if you are at  risk. Ask your health care provider about whether you are at high risk for HIV. Your health care provider may recommend a prescription medicine to help prevent HIV infection. If you choose to take medicine to prevent HIV, you should first get tested for HIV. You should then be tested every 3 months for as long as you are taking the medicine. Pregnancy If you are about to stop having your period (premenopausal) and you may become pregnant, seek counseling before you get pregnant. Take 400 to 800 micrograms (mcg) of folic acid every day if you become pregnant. Ask for birth control (contraception) if you want to prevent pregnancy. Osteoporosis and menopause Osteoporosis is a disease in which the bones lose minerals and strength with aging. This can result in bone fractures. If you are 66 years old or older, or if you are at risk for osteoporosis and fractures, ask your health care provider if you should: Be screened for bone loss. Take a calcium or vitamin D supplement to lower your risk of fractures. Be given hormone replacement therapy (HRT) to treat symptoms of menopause. Follow these instructions at home: Lifestyle Do not use any products that contain nicotine or tobacco, such as cigarettes, e-cigarettes, and chewing tobacco. If you need help quitting, ask your health care provider. Do not use street drugs. Do not share needles. Ask your health care provider for help if you need support or information about quitting drugs. Alcohol use Do not drink alcohol if: Your health care provider tells you not to drink. You are pregnant, may be pregnant, or are planning to become pregnant. If you drink alcohol: Limit how much you use to 0-1 drink a day. Limit intake if you are breastfeeding. Be aware of how much alcohol is in your drink. In the [place] one drink equals one 12 oz bottle of beer (355 mL), one 5 oz glass of wine (148 mL), or one 1 oz glass of hard liquor (44 mL). General  instructions Schedule regular health, dental, and eye exams. Stay current with your vaccines. Tell your health care provider if: You often feel depressed. You have ever been abused or do not feel safe at home. Summary Adopting a healthy lifestyle and getting preventive care are important in promoting health and wellness. Follow your health care provider's instructions about healthy diet, exercising, and getting tested or screened for diseases. Follow your health care provider's instructions on monitoring your cholesterol and blood pressure. This information is not intended to replace advice given to you by your health care provider. Make sure you discuss any questions you have with your healthcare provider. Document Revised: 08/16/2018 Document Reviewed: 08/16/2018 Elsevier Patient Education  2022 [employer].

## 2021-05-04 NOTE — Progress Notes (Signed)
This visit occurred during the SARS-CoV-2 public health emergency.  Safety protocols were in place, including screening questions prior to the visit, additional usage of staff PPE, and extensive cleaning of exam room while observing appropriate contact time as indicated for disinfecting solutions.    Patient ID: Lindsay Mays, female  DOB: 22-May-1976, 45 y.o.   MRN: [id number] Patient Care Team    Relationship Specialty Notifications Start End  Ma Hillock, DO PCP - General Family Medicine  03/10/21   Charlaine Dalton, MD Referring Physician   03/10/21   Malachi Pro, MD Referring Physician Pediatrics  03/10/21   Garnet Sierras, DO Consulting Physician Allergy  03/10/21   Sheldon Silvan., MD Referring Physician Obstetrics and Gynecology  03/10/21     Chief Complaint  Patient presents with   Annual Exam    Pt is fasting;     Subjective: Lindsay Mays is a 45 y.o.  Female  present for CPE . All past medical history, surgical history, allergies, family history, immunizations, medications and social history were updated in the electronic medical record today. All recent labs, ED visits and hospitalizations within the last year were reviewed.  Health maintenance:  Colonoscopy: FHX colon cancer in father (30)She has never had a colonoscopy. Referral placed 03/10/2021- she is waiting to hear from them.  Mammogram: ordered today.  Cervical cancer screening: last pap: 2021- GYN Immunizations: tdap updated today, Influenza declined today- will get in a month (encouraged yearly), covid series completed.  Infectious disease screening: HIV completed w/ preg., Hep C completed today.  DEXA: per routine screen Assistive device: none Oxygen [id number] Patient has a Dental home. Hospitalizations/ED visits: reviewed  Depression screen [facility] 2/9 05/04/2021 03/10/2021  Decreased Interest 0 0  Down, Depressed, Hopeless 0 0  PHQ - 2 Score 0 0   No flowsheet data found.  Immunization  History  Administered Date(s) Administered   Influenza Split 09/07/2011, 12/03/2012   Influenza-Unspecified 06/06/2020   Moderna Sars-Covid-2 Vaccination 11/24/2019, 12/25/2019, 08/11/2020   Tdap 05/04/2021   Past Medical History:  Diagnosis Date   Abnormal Papanicolaou smear of cervix    Asthma    Bronchitis    COVID-19 09/2020   GERD (gastroesophageal reflux disease)    H/O bilateral breast reduction surgery 08/23/2008   Formatting of this note might be different from the original. Overview:  1999 [place], [place] Formatting of this note might be different from the original. [place], [place]   Recurrent upper respiratory infection (URI)    Seasonal allergies    No Known Allergies Past Surgical History:  Procedure Laterality Date   BREAST REDUCTION SURGERY  1996   CESAREAN SECTION  2007   Family History  Problem Relation Age of Onset   Early death Father    Colon cancer Father 7       30s   Colitis Father    Allergic rhinitis Neg Hx    Angioedema Neg Hx    Asthma Neg Hx    Atopy Neg Hx    Eczema Neg Hx    Immunodeficiency Neg Hx    Urticaria Neg Hx    Social History   Social History Narrative   Marital status/children/pets: Married.  2 children   Education/employment: Bachelor's degree.  Works as a Tree surgeon.   Safety:      -smoke alarm in the home:Yes     - wears seatbelt: Yes     - Feels safe in their relationships: Yes  Allergies as of 05/04/2021   No Known Allergies      Medication List        Accurate as of May 04, 2021  3:04 PM. If you have any questions, ask your nurse or doctor.          STOP taking these medications    doxycycline 100 MG tablet Commonly known as: VIBRA-TABS Stopped by: Howard Pouch, DO       TAKE these medications    albuterol 108 (90 Base) MCG/ACT inhaler Commonly known as: VENTOLIN HFA Inhale 2 puffs into the lungs every 4 (four) hours as needed for wheezing or shortness of breath (coughing fits).    Breo Ellipta 200-25 MCG/INH Aepb Generic drug: fluticasone furoate-vilanterol Inhale 1 puff into the lungs daily.   cetirizine 10 MG tablet Commonly known as: ZYRTEC Take by mouth.   EQ BUDESONIDE NASAL NA ADD 10ML (ONE DOSE) OF MEDICATION TO 250ML OF SALINE IN SINUS RINSE BOTTLE. IRRIGATE SINUSES WITH 120ML THROUGH EACH NOSTRIL TWICE DAILY   levonorgestrel 20 MCG/24HR IUD Commonly known as: MIRENA by Intrauterine route.   montelukast 10 MG tablet Commonly known as: Singulair Take 1 tablet (10 mg total) by mouth at bedtime.   nystatin 100000 UNIT/ML suspension Commonly known as: MYCOSTATIN Take 5 mLs (500,000 Units total) by mouth 4 (four) times daily. 5 ml swish for 30 seconds and then spit out, QID, do not swallow.   predniSONE 50 MG tablet Commonly known as: DELTASONE Take 1 tablet (50 mg total) by mouth daily with breakfast. Started by: Howard Pouch, DO   sodium chloride 0.65 % nasal spray Commonly known as: OCEAN Place into the nose.        All past medical history, surgical history, allergies, family history, immunizations andmedications were updated in the EMR today and reviewed under the history and medication portions of their EMR.     No results found for this or any previous visit (from the past 2160 hour(s)).  DG Chest 2 View Result Date: 03/13/2021 IMPRESSION: No acute cardiopulmonary disease. Interstitial thickening, likely related to the clinical history of asthma. Electronically Signed   By: Abigail Miyamoto M.D.   On: 03/13/2021 14:27     ROS: 14 pt review of systems performed and negative (unless mentioned in an HPI)  Objective: BP 109/74   Pulse 78   Temp (!) 97.5 F (36.4 C) (Oral)   Ht 5' 4.75" (1.645 m)   Wt 151 lb (68.5 kg)   SpO2 98%   BMI 25.32 kg/m  Gen: Afebrile. No acute distress. Nontoxic in appearance, well-developed, well-nourished,  very pleasant female.  HENT: AT. [place]. Bilateral TM visualized and normal in appearance, normal  external auditory canal. MMM, no oral lesions, adequate dentition. Bilateral nares within normal limits. Throat without erythema, ulcerations or exudates. no Cough on exam, no hoarseness on exam. Eyes:Pupils Equal Round Reactive to light, Extraocular movements intact,  Conjunctiva without redness, discharge or icterus. Neck/lymp/endocrine: Supple,no lymphadenopathy, no thyromegaly CV: RRR no murmur, no edema, +2/4 P posterior tibialis pulses.  Chest:bilateral  + wheeze, + rhonchi, no crackles. normal Respiratory effort. good Air movement. Abd: Soft. flat. NTND. BS present. no Masses palpated. No hepatosplenomegaly. No rebound tenderness or guarding. Skin: no rashes, purpura or petechiae. Warm and well-perfused. Skin intact. Neuro/Msk: Normal gait. PERLA. EOMi. Alert. Oriented x3.  Cranial nerves II through XII intact. Muscle strength 5/5 upper/lower extremity. DTRs equal bilaterally. Psych: Normal affect, dress and demeanor. Normal speech. Normal thought content and judgment.  No results found.  Assessment/plan Lindsay Mays is a 45 y.o. female present for CPE Encounter for hepatitis C screening test for low risk patient - Hepatitis C antibody Diabetes mellitus screening - Hemoglobin A1c Need for Tdap vaccination - Tdap vaccine greater than or equal to 7yo IM Breast cancer screening by mammogram - MM 3D SCREEN BREAST BILATERAL; Future Screening for deficiency anemia - CBC with Differential/Platelet Screening cholesterol level - Lipid panel Encounter for long-term current use of medication - Comprehensive metabolic panel Asthma exacerbation s/p covid: Prednisone burst prescribed.  Routine general medical examination at a health care facility Colonoscopy: FHX colon cancer in father (30)She has never had a colonoscopy. Referral placed 03/10/2021- she is waiting to hear from them.  Mammogram: ordered today.  Cervical cancer screening: last pap: 2021- GYN Immunizations: tdap updated  today, Influenza declined today- will get in a month (encouraged yearly), covid series completed.  Infectious disease screening: HIV completed w/ preg., Hep C completed today.  DEXA: per routine screen Patient was encouraged to exercise greater than 150 minutes a week. Patient was encouraged to choose a diet filled with fresh fruits and vegetables, and lean meats. AVS provided to patient today for education/recommendation on gender specific health and safety maintenance.  Return in about 1 year (around 05/04/2022) for CPE (30 min).   Orders Placed This Encounter  Procedures   MM 3D SCREEN BREAST BILATERAL   Tdap vaccine greater than or equal to 7yo IM   CBC with Differential/Platelet   Comprehensive metabolic panel   Hemoglobin A1c   Lipid panel   Hepatitis C antibody    Meds ordered this encounter  Medications   predniSONE (DELTASONE) 50 MG tablet    Sig: Take 1 tablet (50 mg total) by mouth daily with breakfast.    Dispense:  5 tablet    Refill:  0    Referral Orders  No referral(s) requested today     Electronically signed by: Howard Pouch, [place]

## 2021-05-05 ENCOUNTER — Other Ambulatory Visit: Payer: Self-pay | Admitting: Family Medicine

## 2021-05-05 LAB — CBC WITH DIFFERENTIAL/PLATELET
Absolute Monocytes: 655 cells/uL (ref 200–950)
Basophils Absolute: 79 cells/uL (ref 0–200)
Basophils Relative: 0.7 %
Eosinophils Absolute: 124 cells/uL (ref 15–500)
Eosinophils Relative: 1.1 %
HCT: 42.5 % (ref 35.0–45.0)
Hemoglobin: 14.6 g/dL (ref 11.7–15.5)
Lymphs Abs: 3187 cells/uL (ref 850–3900)
MCH: 30.3 pg (ref 27.0–33.0)
MCHC: 34.4 g/dL (ref 32.0–36.0)
MCV: 88.2 fL (ref 80.0–100.0)
MPV: 10.1 fL (ref 7.5–12.5)
Monocytes Relative: 5.8 %
Neutro Abs: 7255 cells/uL (ref 1500–7800)
Neutrophils Relative %: 64.2 %
Platelets: 412 10*3/uL — ABNORMAL HIGH (ref 140–400)
RBC: 4.82 10*6/uL (ref 3.80–5.10)
RDW: 11.8 % (ref 11.0–15.0)
Total Lymphocyte: 28.2 %
WBC: 11.3 10*3/uL — ABNORMAL HIGH (ref 3.8–10.8)

## 2021-05-05 LAB — COMPREHENSIVE METABOLIC PANEL
AG Ratio: 1.3 (calc) (ref 1.0–2.5)
ALT: 14 U/L (ref 6–29)
AST: 15 U/L (ref 10–35)
Albumin: 4 g/dL (ref 3.6–5.1)
Alkaline phosphatase (APISO): 55 U/L (ref 31–125)
BUN: 8 mg/dL (ref 7–25)
CO2: 21 mmol/L (ref 20–32)
Calcium: 9.2 mg/dL (ref 8.6–10.2)
Chloride: 106 mmol/L (ref 98–110)
Creat: 0.7 mg/dL (ref 0.50–0.99)
Globulin: 3 g/dL (calc) (ref 1.9–3.7)
Glucose, Bld: 74 mg/dL (ref 65–99)
Potassium: 3.7 mmol/L (ref 3.5–5.3)
Sodium: 139 mmol/L (ref 135–146)
Total Bilirubin: 0.7 mg/dL (ref 0.2–1.2)
Total Protein: 7 g/dL (ref 6.1–8.1)

## 2021-05-05 LAB — HEPATITIS C ANTIBODY
Hepatitis C Ab: NONREACTIVE
SIGNAL TO CUT-OFF: 0.03 (ref <1.00)

## 2021-05-05 LAB — LIPID PANEL
Cholesterol: 179 mg/dL (ref <200)
HDL: 47 mg/dL — ABNORMAL LOW (ref >=50)
LDL Cholesterol (Calc): 113 mg/dL (calc) — ABNORMAL HIGH
Non-HDL Cholesterol (Calc): 132 mg/dL (calc) — ABNORMAL HIGH (ref <130)
Total CHOL/HDL Ratio: 3.8 (calc) (ref <5.0)
Triglycerides: 90 mg/dL (ref <150)

## 2021-05-05 LAB — HEMOGLOBIN A1C
Hgb A1c MFr Bld: 4.8 % of total Hgb (ref <5.7)
Mean Plasma Glucose: 91 mg/dL
eAG (mmol/L): 5 mmol/L

## 2021-05-05 MED ORDER — AZITHROMYCIN 250 MG PO TABS: ORAL_TABLET | ORAL | 0 refills | Status: AC

## 2021-05-18 ENCOUNTER — Encounter: Payer: Self-pay | Admitting: Gastroenterology

## 2021-06-12 DIAGNOSIS — J4541 Moderate persistent asthma with (acute) exacerbation: Secondary | ICD-10-CM | POA: Diagnosis not present

## 2021-06-12 DIAGNOSIS — K219 Gastro-esophageal reflux disease without esophagitis: Secondary | ICD-10-CM | POA: Diagnosis not present

## 2021-06-12 DIAGNOSIS — J309 Allergic rhinitis, unspecified: Secondary | ICD-10-CM | POA: Diagnosis not present

## 2021-06-12 DIAGNOSIS — Z87891 Personal history of nicotine dependence: Secondary | ICD-10-CM | POA: Diagnosis not present

## 2021-06-22 ENCOUNTER — Ambulatory Visit (AMBULATORY_SURGERY_CENTER): Payer: BC Managed Care – PPO

## 2021-06-22 ENCOUNTER — Other Ambulatory Visit: Payer: Self-pay

## 2021-06-22 VITALS — Ht 65.75 in | Wt 150.0 lb

## 2021-06-22 DIAGNOSIS — Z1211 Encounter for screening for malignant neoplasm of colon: Secondary | ICD-10-CM

## 2021-06-22 MED ORDER — PLENVU 140 G PO SOLR
1.0000 | ORAL | 0 refills | Status: DC
Start: 2021-06-22 — End: 2021-07-08

## 2021-06-22 NOTE — Progress Notes (Signed)
Pre visit completed via phone call; Patient verified name, DOB, and address; No egg or soy allergy known to patient  No issues known to pt with past sedation with any surgeries or procedures Patient denies ever being told they had issues or difficulty with intubation  No FH of Malignant Hyperthermia Pt is not on diet pills Pt is not on home 02  Pt is not on blood thinners  Pt denies issues with constipation at this time; No A fib or A flutter Pt is fully vaccinated for Covid x 2; Coupon given to pt in PV today, Code to Pharmacy and NO PA's for preps discussed with pt in PV today  Discussed with pt there will be an out-of-pocket cost for prep and that varies from $0 to 70 +  dollars - pt verbalized understanding  Due to the COVID-19 pandemic we are asking patients to follow certain guidelines in PV and the [place]   Pt aware of COVID protocols and [facility] guidelines

## 2021-06-24 ENCOUNTER — Encounter: Payer: Self-pay | Admitting: Gastroenterology

## 2021-07-01 DIAGNOSIS — R053 Chronic cough: Secondary | ICD-10-CM | POA: Diagnosis not present

## 2021-07-01 DIAGNOSIS — R062 Wheezing: Secondary | ICD-10-CM | POA: Diagnosis not present

## 2021-07-01 DIAGNOSIS — R911 Solitary pulmonary nodule: Secondary | ICD-10-CM | POA: Diagnosis not present

## 2021-07-08 ENCOUNTER — Other Ambulatory Visit: Payer: Self-pay

## 2021-07-08 ENCOUNTER — Encounter: Payer: Self-pay | Admitting: Gastroenterology

## 2021-07-08 ENCOUNTER — Ambulatory Visit (AMBULATORY_SURGERY_CENTER): Payer: BC Managed Care – PPO | Admitting: Gastroenterology

## 2021-07-08 VITALS — BP 123/60 | HR 94 | Temp 98.6°F | Resp 15 | Ht 64.75 in | Wt 150.0 lb

## 2021-07-08 DIAGNOSIS — D124 Benign neoplasm of descending colon: Secondary | ICD-10-CM

## 2021-07-08 DIAGNOSIS — K640 First degree hemorrhoids: Secondary | ICD-10-CM

## 2021-07-08 DIAGNOSIS — K5730 Diverticulosis of large intestine without perforation or abscess without bleeding: Secondary | ICD-10-CM

## 2021-07-08 DIAGNOSIS — K635 Polyp of colon: Secondary | ICD-10-CM

## 2021-07-08 DIAGNOSIS — Z1211 Encounter for screening for malignant neoplasm of colon: Secondary | ICD-10-CM | POA: Diagnosis not present

## 2021-07-08 DIAGNOSIS — Z800 Family history of malignant neoplasm of digestive organs: Secondary | ICD-10-CM

## 2021-07-08 MED ORDER — SODIUM CHLORIDE 0.9 % IV SOLN
500.0000 mL | Freq: Once | INTRAVENOUS | Status: DC
Start: 2021-07-08 — End: 2021-07-08

## 2021-07-08 NOTE — Progress Notes (Signed)
GASTROENTEROLOGY PROCEDURE H&P NOTE   Primary Care Physician: Ma Hillock, DO    Reason for Procedure:  Colon Cancer screening  Plan:    Colonoscopy  Patient is appropriate for endoscopic procedure(s) in the ambulatory ([facility]) setting.  The nature of the procedure, as well as the risks, benefits, and alternatives were carefully and thoroughly reviewed with the patient. Ample time for discussion and questions allowed. The patient understood, was satisfied, and agreed to proceed.     HPI: Lindsay Mays is a 45 y.o. female who presents for colonoscopy for routine Colon Cancer screening.  No active GI symptoms.  Fhx notable for father with colitis then diagnosed with Colon Cancer at age 56. Patient is otherwise without complaints or active issues today.  Past Medical History:  Diagnosis Date   Abnormal Papanicolaou smear of cervix    Asthma    on daily meds with rescue   Bronchitis    COVID-19 09/2020   GERD (gastroesophageal reflux disease)    on meds   H/O bilateral breast reduction surgery 08/23/2008   Formatting of this note might be different from the original. Overview:  [place], [place] of this note might be different from the original. [place], [place]   Recurrent upper respiratory infection (URI)    Seasonal allergies     Past Surgical History:  Procedure Laterality Date   BREAST REDUCTION SURGERY  1996   CESAREAN SECTION  2007   WISDOM TOOTH EXTRACTION      Prior to Admission medications   Medication Sig Start Date End Date Taking? Authorizing Provider  Famotidine (PEPCID PO) Take 1 tablet by mouth daily at 6 (six) AM.   Yes [provider]  fluticasone furoate-vilanterol (BREO ELLIPTA) 200-25 MCG/INH AEPB Inhale 1 puff into the lungs daily.   Yes [provider]  levonorgestrel (MIRENA) 20 MCG/24HR IUD by Intrauterine route.   Yes [provider]  Multiple Vitamin (MULTIVITAMIN PO) Take 2 tablets by mouth daily at  6 (six) AM.   Yes [provider]  albuterol (VENTOLIN HFA) 108 (90 Base) MCG/ACT inhaler Inhale 2 puffs into the lungs every 4 (four) hours as needed for wheezing or shortness of breath (coughing fits). 10/30/20   Garnet Sierras, DO  cetirizine (ZYRTEC) 10 MG tablet Take 10 mg by mouth daily as needed for allergies.    [provider]  montelukast (SINGULAIR) 10 MG tablet Take 1 tablet (10 mg total) by mouth at bedtime. 10/30/20   Garnet Sierras, DO  nystatin (MYCOSTATIN) 100000 UNIT/ML suspension Take 5 mLs (500,000 Units total) by mouth 4 (four) times daily. 5 ml swish for 30 seconds and then spit out, QID, do not swallow. Patient not taking: No sig reported 03/10/21   Howard Pouch A, DO    Current Outpatient Medications  Medication Sig Dispense Refill   Famotidine (PEPCID PO) Take 1 tablet by mouth daily at 6 (six) AM.     fluticasone furoate-vilanterol (BREO ELLIPTA) 200-25 MCG/INH AEPB Inhale 1 puff into the lungs daily.     levonorgestrel (MIRENA) 20 MCG/24HR IUD by Intrauterine route.     Multiple Vitamin (MULTIVITAMIN PO) Take 2 tablets by mouth daily at 6 (six) AM.     albuterol (VENTOLIN HFA) 108 (90 Base) MCG/ACT inhaler Inhale 2 puffs into the lungs every 4 (four) hours as needed for wheezing or shortness of breath (coughing fits). 54 g 0   cetirizine (ZYRTEC) 10 MG tablet Take 10 mg by mouth  daily as needed for allergies.     montelukast (SINGULAIR) 10 MG tablet Take 1 tablet (10 mg total) by mouth at bedtime. 90 tablet 3   nystatin (MYCOSTATIN) 100000 UNIT/ML suspension Take 5 mLs (500,000 Units total) by mouth 4 (four) times daily. 5 ml swish for 30 seconds and then spit out, QID, do not swallow. (Patient not taking: No sig reported) 100 mL 0   Current Facility-Administered Medications  Medication Dose Route Frequency Provider Last Rate Last Admin   0.9 %  sodium chloride infusion  500 mL Intravenous Once Mandisa Persinger V, DO        Allergies as of 07/08/2021   (No  Known Allergies)    Family History  Problem Relation Age of Onset   Early death Father    Colon cancer Father 90       secondary to colitis   Colitis Father    Allergic rhinitis Neg Hx    Angioedema Neg Hx    Asthma Neg Hx    Atopy Neg Hx    Eczema Neg Hx    Immunodeficiency Neg Hx    Urticaria Neg Hx    Esophageal cancer Neg Hx    Stomach cancer Neg Hx    Rectal cancer Neg Hx     Social History   Socioeconomic History   Marital status: Married    Spouse name: Not on file   Number of children: Not on file   Years of education: Not on file   Highest education level: Not on file  Occupational History   Not on file  Tobacco Use   Smoking status: Former    Types: Cigarettes   Smokeless tobacco: Never   Tobacco comments:    9/4 quit 10 years ago  Vaping Use   Vaping Use: Never used  Substance and Sexual Activity   Alcohol use: Yes    Alcohol/week: 4.0 standard drinks    Types: 4 Standard drinks or equivalent per week   Drug use: Never   Sexual activity: Yes    Partners: Male    Comment: Mirena   Other Topics Concern   Not on file  Social History Narrative   Marital status/children/pets: Married.  2 children   Education/employment: Bachelor's degree.  Works as a Tree surgeon.   Safety:      -smoke alarm in the home:Yes     - wears seatbelt: Yes     - Feels safe in their relationships: Yes      Social Determinants of Health   Financial Resource Strain: Not on file  Food Insecurity: Not on file  Transportation Needs: Not on file  Physical Activity: Not on file  Stress: Not on file  Social Connections: Not on file  Intimate Partner Violence: Not on file    Physical Exam: Vital signs in last 24 hours: @BP  119/77   Pulse 75   Temp 98.6 F (37 C)   Resp 10   Ht 5' 4.75" (1.645 m)   Wt 150 lb (68 kg)   SpO2 99%   BMI 25.15 kg/m  GEN: NAD EYE: Sclerae anicteric ENT: MMM CV: Non-tachycardic Pulm: CTA b/l GI: Soft, NT/ND NEURO:  Alert & Oriented  x 3   Gerrit Heck, DO [facility]   07/08/2021 9:08 AM

## 2021-07-08 NOTE — Patient Instructions (Signed)
Handouts given on polyps, hemorrhoids and diverticulosis.  Pt encouraged to use fiber supplement, such as Citrucel, Fibercon, Lonsyl or Metamucil  YOU HAD AN ENDOSCOPIC PROCEDURE TODAY AT [place]:   Refer to the procedure report that was given to you for any specific questions about what was found during the examination.  If the procedure report does not answer your questions, please call your gastroenterologist to clarify.  If you requested that your care partner not be given the details of your procedure findings, then the procedure report has been included in a sealed envelope for you to review at your convenience later.  YOU SHOULD EXPECT: Some feelings of bloating in the abdomen. Passage of more gas than usual.  Walking can help get rid of the air that was put into your GI tract during the procedure and reduce the bloating. If you had a lower endoscopy (such as a colonoscopy or flexible sigmoidoscopy) you may notice spotting of blood in your stool or on the toilet paper. If you underwent a bowel prep for your procedure, you may not have a normal bowel movement for a few days.  Please Note:  You might notice some irritation and congestion in your nose or some drainage.  This is from the oxygen used during your procedure.  There is no need for concern and it should clear up in a day or so.  SYMPTOMS TO REPORT IMMEDIATELY:  Following lower endoscopy (colonoscopy or flexible sigmoidoscopy):  Excessive amounts of blood in the stool  Significant tenderness or worsening of abdominal pains  Swelling of the abdomen that is new, acute  Fever of 100F or higher  For urgent or emergent issues, a gastroenterologist can be reached at any hour by calling [phone]. Do not use MyChart messaging for urgent concerns.    DIET:  We do recommend a small meal at first, but then you may proceed to your regular diet.  Drink plenty of fluids but you should avoid alcoholic beverages for 24  hours.  ACTIVITY:  You should plan to take it easy for the rest of today and you should NOT DRIVE or use heavy machinery until tomorrow (because of the sedation medicines used during the test).    FOLLOW UP: Our staff will call the number listed on your records 48-72 hours following your procedure to check on you and address any questions or concerns that you may have regarding the information given to you following your procedure. If we do not reach you, we will leave a message.  We will attempt to reach you two times.  During this call, we will ask if you have developed any symptoms of COVID 19. If you develop any symptoms (ie: fever, flu-like symptoms, shortness of breath, cough etc.) before then, please call [phone].  If you test positive for Covid 19 in the 2 weeks post procedure, please call and report this information to [place].    If any biopsies were taken you will be contacted by phone or by letter within the next 1-3 weeks.  Please call us at [phone] if you have not heard about the biopsies in 3 weeks.    SIGNATURES/CONFIDENTIALITY: You and/or your care partner have signed paperwork which will be entered into your electronic medical record.  These signatures attest to the fact that that the information above on your After Visit Summary has been reviewed and is understood.  Full responsibility of the confidentiality of this discharge information lies with you and/or  your care-partner.

## 2021-07-08 NOTE — Op Note (Signed)
[place] Patient Name: Lindsay Mays Procedure Date: 07/08/2021 8:55 AM MRN: [id number] Endoscopist: Gerrit Heck , MD Age: 45 Referring MD:  Date of Birth: July 26, 1976 Gender: Female Account #: [id number] Procedure:                Colonoscopy Indications:              Screening in patient at increased risk: Colorectal                            cancer in father before age 67 (history of colitis                            and diagnosed with Colon Cancer in his early 49's).                           She is otherwise without GI symptoms. This is the                            patient's first colonoscopy. Medicines:                Monitored Anesthesia Care Procedure:                Pre-Anesthesia Assessment:                           - Prior to the procedure, a History and Physical                            was performed, and patient medications and                            allergies were reviewed. The patient's tolerance of                            previous anesthesia was also reviewed. The risks                            and benefits of the procedure and the sedation                            options and risks were discussed with the patient.                            All questions were answered, and informed consent                            was obtained. Prior Anticoagulants: The patient has                            taken no previous anticoagulant or antiplatelet                            agents. ASA Grade Assessment: II - A patient with  mild systemic disease. After reviewing the risks                            and benefits, the patient was deemed in                            satisfactory condition to undergo the procedure.                           After obtaining informed consent, the colonoscope                            was passed under direct vision. Throughout the                            procedure, the patient's blood  pressure, pulse, and                            oxygen saturations were monitored continuously. The                            Olympus CF-HQ190L (Serial# [id number]) Colonoscope was                            introduced through the anus and advanced to the the                            cecum, identified by appendiceal orifice and                            ileocecal valve. The colonoscopy was performed                            without difficulty. The patient tolerated the                            procedure well. The quality of the bowel                            preparation was good. The ileocecal valve,                            appendiceal orifice, and rectum were photographed. Scope In: 9:13:57 AM Scope Out: 9:34:56 AM Scope Withdrawal Time: 0 hours 14 minutes 51 seconds  Total Procedure Duration: 0 hours 20 minutes 59 seconds  Findings:                 The perianal and digital rectal examinations were                            normal.                           A 12 mm polyp was found in the hepatic flexure. The  polyp was flat. The polyp was removed with a cold                            snare. Resection and retrieval were complete.                            Estimated blood loss was minimal.                           A 3 mm polyp was found in the descending colon. The                            polyp was sessile. The polyp was removed with a                            cold snare. Resection and retrieval were complete.                            Estimated blood loss was minimal.                           A few small and large-mouthed diverticula were                            found in the sigmoid colon and transverse colon.                           Non-bleeding internal hemorrhoids were found during                            retroflexion. The hemorrhoids were small.                           The sigmoid colon was moderately tortuous. Complications:             No immediate complications. Estimated Blood Loss:     Estimated blood loss was minimal. Impression:               - One 12 mm polyp at the hepatic flexure, removed                            with a cold snare. Resected and retrieved.                           - One 3 mm polyp in the descending colon, removed                            with a cold snare. Resected and retrieved.                           - Diverticulosis in the sigmoid colon and in the                            transverse colon.                           -  Non-bleeding internal hemorrhoids.                           - Tortuous colon. Recommendation:           - Patient has a contact number available for                            emergencies. The signs and symptoms of potential                            delayed complications were discussed with the                            patient. Return to normal activities tomorrow.                            Written discharge instructions were provided to the                            patient.                           - Resume previous diet.                           - Continue present medications.                           - Await pathology results.                           - Repeat colonoscopy in 5 years for surveillance,                            or sooner based on pathology results.                           - Return to GI office PRN.                           - Use fiber, for example Citrucel, Fibercon, Konsyl                            or Metamucil. Gerrit Heck, MD 07/08/2021 9:40:32 AM

## 2021-07-08 NOTE — Progress Notes (Signed)
Called to room to assist during endoscopic procedure.  Patient ID and intended procedure confirmed with present staff. Received instructions for my participation in the procedure from the performing physician.  

## 2021-07-08 NOTE — Progress Notes (Signed)
Pt's states no medical or surgical changes since previsit or office visit.   VS taken by DT 

## 2021-07-08 NOTE — Progress Notes (Signed)
Report to PACU, RN, vss, BBS= Clear.  

## 2021-07-10 ENCOUNTER — Telehealth: Payer: Self-pay

## 2021-07-10 NOTE — Telephone Encounter (Signed)
Left message on follow up call. 

## 2021-07-10 NOTE — Telephone Encounter (Signed)
Attempted f/u call. No answer, left VM. 

## 2021-07-18 ENCOUNTER — Encounter: Payer: Self-pay | Admitting: Gastroenterology

## 2021-08-13 ENCOUNTER — Ambulatory Visit: Payer: BC Managed Care – PPO | Admitting: Allergy

## 2021-08-18 ENCOUNTER — Encounter: Payer: Self-pay | Admitting: Allergy

## 2021-08-18 ENCOUNTER — Ambulatory Visit (INDEPENDENT_AMBULATORY_CARE_PROVIDER_SITE_OTHER): Payer: BC Managed Care – PPO | Admitting: Allergy

## 2021-08-18 ENCOUNTER — Other Ambulatory Visit: Payer: Self-pay

## 2021-08-18 VITALS — BP 118/80 | HR 95 | Temp 98.2°F | Resp 16 | Ht 65.0 in | Wt 156.4 lb

## 2021-08-18 DIAGNOSIS — H1013 Acute atopic conjunctivitis, bilateral: Secondary | ICD-10-CM

## 2021-08-18 DIAGNOSIS — B999 Unspecified infectious disease: Secondary | ICD-10-CM | POA: Diagnosis not present

## 2021-08-18 DIAGNOSIS — J4540 Moderate persistent asthma, uncomplicated: Secondary | ICD-10-CM | POA: Diagnosis not present

## 2021-08-18 DIAGNOSIS — Z9889 Other specified postprocedural states: Secondary | ICD-10-CM

## 2021-08-18 DIAGNOSIS — Z8709 Personal history of other diseases of the respiratory system: Secondary | ICD-10-CM

## 2021-08-18 DIAGNOSIS — K219 Gastro-esophageal reflux disease without esophagitis: Secondary | ICD-10-CM

## 2021-08-18 DIAGNOSIS — J301 Allergic rhinitis due to pollen: Secondary | ICD-10-CM | POA: Diagnosis not present

## 2021-08-18 HISTORY — DX: Personal history of other diseases of the respiratory system: Z87.09

## 2021-08-18 MED ORDER — OMEPRAZOLE 40 MG PO CPDR: 40.0000 mg | DELAYED_RELEASE_CAPSULE | Freq: Every day | ORAL | 3 refills | Status: DC

## 2021-08-18 MED ORDER — TRELEGY ELLIPTA 200-62.5-25 MCG/ACT IN AEPB: 1.0000 | INHALATION_SPRAY | Freq: Every day | RESPIRATORY_TRACT | 3 refills | Status: DC

## 2021-08-18 MED ORDER — ALBUTEROL SULFATE (2.5 MG/3ML) 0.083% IN NEBU: 2.5000 mg | INHALATION_SOLUTION | RESPIRATORY_TRACT | 2 refills | Status: AC | PRN

## 2021-08-18 MED ORDER — XHANCE 93 MCG/ACT NA EXHU: 2.0000 | INHALANT_SUSPENSION | Freq: Two times a day (BID) | NASAL | 5 refills | Status: DC

## 2021-08-18 NOTE — Assessment & Plan Note (Signed)
·   See handout for lifestyle and dietary modifications.  Start omeprazole 40mg  daily in the morning. Nothing to eat or drink for 30 minutes.

## 2021-08-18 NOTE — Progress Notes (Signed)
Follow Up Note  RE: Lindsay Mays MRN: [id number] DOB: June 25, 1976 Date of Office Visit: 08/18/2021  Referring provider: Ma Hillock, DO Primary care provider: Ma Hillock, DO  Chief Complaint: Asthma (Yesterday went upstairs and had not difficulty breathing rescue inhaler helped.  Went to pulmonology a couple of months ago and did a CT Scan everything came back normal. She was told to see her asthma doctor for immunotherapy options. ), Allergic Rhinitis , and Cough (Has been coughing in the past 2 days - with flem ( greenish/yellow )  History of Present Illness: I had the pleasure of seeing Lindsay Mays for a follow up visit at the [facility] on 08/18/2021. She is a 45 y.o. female, who is being followed for asthma, allergic rhinoconjunctivitis. Her previous allergy office visit was on 02/05/2021 with Dr. Maudie Mercury. Today is a regular follow up visit.  Moderate persistent asthma Saw pulmonology in October and had CT chest was done - see below for results.  Currently taking Breo 233mcg 1 puff once a day and has been using albuterol twice a day for the past few days due to coughing. Still taking Singulair 10mg  daily.   Patient was given prednisone and antibiotics in October with good benefit.   Patient takes NSAIDs occasionally for headaches. Not sure if it flares her asthma.   Patient concerned about her immune system and getting frequent infections.  She also has GERD and taking Pepcid prn with some benefit.   Allergic rhino conjunctivitis Currently not taking any antihistamines or nasal sprays.  Had polypectomy in January 2022.  Still having anosmia.   07/01/2021 CT chest: "IMPRESSION:  1. Tree-in-bud opacities in the bilateral lower lobes and right  middle lobe compatible with infectious/inflammatory process.  2. There are few scattered nodular densities in the right middle  lobe and right lower lobe measuring up to 5 mm. These may be  infectious, but are  indeterminate. "  Assessment and Plan: Lindsay Mays is a 45 y.o. female with: Not well controlled moderate persistent asthma Past history - Diagnosed with COVID-19 in January 2021 and developed a persistent cough afterwards.  Evaluated by pulmonology and diagnosed with asthma. Up-to-date with COVID-19 vaccination. Interim history - had Covid-19 in July again. Last course of prednisone in October. Had 5 courses this year. CT chest showed nodular densities.   Today's spirometry showed some restriction. Daily controller medication(s): START Trelegy 266mcg 1 puff once a day and rinse mouth after each use.  Samples given. STOP Breo for now. During upper respiratory infections/asthma flares:  Use albuterol 2 puffs or albuterol nebulizer twice a day for 1-2 weeks until symptoms subside.  If you need to use your albuterol nebulizer machine back to back within 15-30 minutes with no relief then please go to the ER/urgent care for further evaluation.  May use albuterol rescue inhaler 2 puffs every 4 to 6 hours as needed for shortness of breath, chest tightness, coughing, and wheezing. May use albuterol rescue inhaler 2 puffs 5 to 15 minutes prior to strenuous physical activities. Monitor frequency of use.  Nebulizer machine given and demonstrated proper use.  Get bloodwork to see if qualify for asthma biologics. Monitor symptoms after taking NSAIDs. Get spirometry at next visit. If symptoms not improved by the end of the week - will send in prednisone and/or antibiotics if needed.   Seasonal allergic rhinitis due to pollen Past history - Rhinoconjunctivitis symptoms for many years mainly in the spring and  fall but now worsening since May.  2021 bloodwork was negative. 2021 skin testing showed: Positive to grass pollen only. S/p polypectomy and deviated septum repair in 2022.  Interim history - only takes Singulair.  Continue environmental control measures as below. May use over the counter antihistamines such  as Zyrtec (cetirizine), Claritin (loratadine), Allegra (fexofenadine), or Xyzal (levocetirizine) daily as needed. May take twice a day if needed. Continue Singulair (montelukast) 10mg  daily at night. Nasal saline spray (i.e., Simply Saline) or nasal saline lavage (i.e., NeilMed) is recommended as needed and prior to medicated nasal sprays.  History of nasal polypectomy Past history - polypectomy in January 2022. Interim history - anosmia unchanged. Start Xhance (fluticasone) nasal spray 2 sprays per nostril twice a day as needed for nasal congestion.  Sample given and demonstrated proper use. If this is not covered let us know.   Recurrent infections Keep track of infections. Get bloodwork to look at immune system.  Gastroesophageal reflux disease See handout for lifestyle and dietary modifications. Start omeprazole 40mg  daily in the morning. Nothing to eat or drink for 30 minutes.  Return in about 2 months (around 10/19/2021).  Meds ordered this encounter  Medications   omeprazole (PRILOSEC) 40 MG capsule    Sig: Take 1 capsule (40 mg total) by mouth daily.    Dispense:  30 capsule    Refill:  3   Fluticasone-Umeclidin-Vilant (TRELEGY ELLIPTA) 200-62.5-25 MCG/ACT AEPB    Sig: Inhale 1 puff into the lungs daily. Rinse mouth after each use.    Dispense:  60 each    Refill:  3   albuterol (PROVENTIL) (2.5 MG/3ML) 0.083% nebulizer solution    Sig: Take 3 mLs (2.5 mg total) by nebulization every 4 (four) hours as needed for wheezing or shortness of breath (coughing fits).    Dispense:  75 mL    Refill:  2   Fluticasone Propionate (XHANCE) 93 MCG/ACT EXHU    Sig: Place 2 sprays into both nostrils 2 (two) times daily.    Dispense:  32 mL    Refill:  5    Lab Orders         Allergens w/Total IgE Area 2         CBC with Differential/Platelet         Strep pneumoniae 23 Serotypes IgG         Complement, total         Diphtheria / Tetanus Antibody Panel       Diagnostics: Spirometry:  Tracings reviewed. Her effort: Good reproducible efforts. FVC: 2.66L FEV1: 2.19L, 75% predicted FEV1/FVC ratio: 82% Interpretation: Spirometry consistent with possible restrictive disease.  Please see scanned spirometry results for details.  Medication List:  Current Outpatient Medications  Medication Sig Dispense Refill   albuterol (PROVENTIL) (2.5 MG/3ML) 0.083% nebulizer solution Take 3 mLs (2.5 mg total) by nebulization every 4 (four) hours as needed for wheezing or shortness of breath (coughing fits). 75 mL 2   albuterol (VENTOLIN HFA) 108 (90 Base) MCG/ACT inhaler Inhale 2 puffs into the lungs every 4 (four) hours as needed for wheezing or shortness of breath (coughing fits). 54 g 0   cetirizine (ZYRTEC) 10 MG tablet Take 10 mg by mouth daily as needed for allergies.     Famotidine (PEPCID PO) Take 1 tablet by mouth daily at 6 (six) AM.     Fluticasone Propionate (XHANCE) 93 MCG/ACT EXHU Place 2 sprays into both nostrils 2 (two) times daily. 32 mL 5  Fluticasone-Umeclidin-Vilant (TRELEGY ELLIPTA) 200-62.5-25 MCG/ACT AEPB Inhale 1 puff into the lungs daily. Rinse mouth after each use. 60 each 3   levonorgestrel (MIRENA) 20 MCG/24HR IUD by Intrauterine route.     montelukast (SINGULAIR) 10 MG tablet Take 1 tablet (10 mg total) by mouth at bedtime. 90 tablet 3   Multiple Vitamin (MULTIVITAMIN PO) Take 2 tablets by mouth daily at 6 (six) AM.     omeprazole (PRILOSEC) 40 MG capsule Take 1 capsule (40 mg total) by mouth daily. 30 capsule 3   No current facility-administered medications for this visit.   Allergies: No Known Allergies I reviewed her past medical history, social history, family history, and environmental history and no significant changes have been reported from her previous visit.  Review of Systems  Constitutional:  Negative for appetite change, chills, fever and unexpected weight change.  HENT:  Negative for congestion, rhinorrhea and  sneezing.   Eyes:  Negative for itching.  Respiratory:  Positive for cough, chest tightness, shortness of breath and wheezing.   Cardiovascular:  Negative for chest pain.  Gastrointestinal:  Negative for abdominal pain.  Genitourinary:  Negative for difficulty urinating.  Skin:  Negative for rash.  Allergic/Immunologic: Positive for environmental allergies.  Neurological:  Negative for headaches.   Objective: BP 118/80   Pulse 95   Temp 98.2 F (36.8 C)   Resp 16   Ht 5\' 5"  (1.651 m)   Wt 156 lb 6.4 oz (70.9 kg)   SpO2 97%   BMI 26.03 kg/m  Body mass index is 26.03 kg/m. Physical Exam Vitals and nursing note reviewed.  Constitutional:      Appearance: Normal appearance. She is well-developed.  HENT:     Head: Normocephalic and atraumatic.     Right Ear: Tympanic membrane and external ear normal.     Left Ear: Tympanic membrane and external ear normal.     Nose: Nose normal.     Mouth/Throat:     Mouth: Mucous membranes are moist.     Pharynx: Oropharynx is clear.  Eyes:     Conjunctiva/sclera: Conjunctivae normal.  Cardiovascular:     Rate and Rhythm: Normal rate and regular rhythm.     Heart sounds: Normal heart sounds. No murmur heard. Pulmonary:     Effort: Pulmonary effort is normal.     Breath sounds: Wheezing present. No rhonchi or rales.     Comments: Slight wheezing on right lower lung Musculoskeletal:     Cervical back: Neck supple.  Skin:    General: Skin is warm.     Findings: No rash.  Neurological:     Mental Status: She is alert and oriented to person, place, and time.  Psychiatric:        Behavior: Behavior normal.  Previous notes and tests were reviewed. The plan was reviewed with the patient/family, and all questions/concerned were addressed.  It was my pleasure to see Lindsay Mays today and participate in her care. Please feel free to contact me with any questions or concerns.  Sincerely,  Rexene Alberts, DO Allergy & Immunology  [facility] office: [place] office: [phone]

## 2021-08-18 NOTE — Assessment & Plan Note (Signed)
•   Keep track of infections.  Get bloodwork to look at immune system.

## 2021-08-18 NOTE — Patient Instructions (Addendum)
Asthma: Daily controller medication(s): START Trelegy 243mcg 1 puff once a day and rinse mouth after each use.  Samples given. STOP Breo for now. During upper respiratory infections/asthma flares:  Use albuterol 2 puffs or albuterol nebulizer twice a day for 1-2 weeks until symptoms subside.  If you need to use your albuterol nebulizer machine back to back within 15-30 minutes with no relief then please go to the ER/urgent care for further evaluation.  May use albuterol rescue inhaler 2 puffs every 4 to 6 hours as needed for shortness of breath, chest tightness, coughing, and wheezing. May use albuterol rescue inhaler 2 puffs 5 to 15 minutes prior to strenuous physical activities. Monitor frequency of use.  Asthma control goals:  Full participation in all desired activities (may need albuterol before activity) Albuterol use two times or less a week on average (not counting use with activity) Cough interfering with sleep two times or less a month Oral steroids no more than once a year No hospitalizations  Nebulizer machine given and demonstrated proper use.   Get bloodwork to see if you qualify for an injection for asthma.  We are ordering labs, so please allow 1-2 weeks for the results to come back. With the newly implemented Cures Act, the labs might be visible to you at the same time that they become visible to me. However, I will not address the results until all of the results are back, so please be patient.  In the meantime, continue recommendations in your patient instructions, including avoidance measures (if applicable), until you hear from me.  Environmental allergies 2021 skin testing positive to grass pollen.  Continue environmental control measures as below. May use over the counter antihistamines such as Zyrtec (cetirizine), Claritin (loratadine), Allegra (fexofenadine), or Xyzal (levocetirizine) daily as needed. May take twice a day if needed. Continue Singulair (montelukast)  10mg  daily at night. Nasal saline spray (i.e., Simply Saline) or nasal saline lavage (i.e., NeilMed) is recommended as needed and prior to medicated nasal sprays.  History of polyps Start Xhance (fluticasone) nasal spray 2 sprays per nostril twice a day as needed for nasal congestion.  Sample given and demonstrated proper use. If this is not covered let us know.   Heartburn: See handout for lifestyle and dietary modifications. Start omeprazole 40mg  daily in the morning. Nothing to eat or drink for 30 minutes.  Infections Keep track of infections. Get bloodwork to look at immune system.  Follow up in 2 months or sooner if needed.   Reducing Pollen Exposure Pollen seasons: trees (spring), grass (summer) and ragweed/weeds (fall). Keep windows closed in your home and car to lower pollen exposure.  Install air conditioning in the bedroom and throughout the house if possible.  Avoid going out in dry windy days - especially early morning. Pollen counts are highest between 5 - 10 AM and on dry, hot and windy days.  Save outside activities for late afternoon or after a heavy rain, when pollen levels are lower.  Avoid mowing of grass if you have grass pollen allergy. Be aware that pollen can also be transported indoors on people and pets.  Dry your clothes in an automatic dryer rather than hanging them outside where they might collect pollen.  Rinse hair and eyes before bedtime.

## 2021-08-18 NOTE — Assessment & Plan Note (Addendum)
Past history - Rhinoconjunctivitis symptoms for many years mainly in the spring and fall but now worsening since May.  2021 bloodwork was negative. 2021 skin testing showed: Positive to grass pollen only. S/p polypectomy and deviated septum repair in 2022.  Interim history - only takes Singulair.   Continue environmental control measures as below.  May use over the counter antihistamines such as Zyrtec (cetirizine), Claritin (loratadine), Allegra (fexofenadine), or Xyzal (levocetirizine) daily as needed. May take twice a day if needed.  Continue Singulair (montelukast) 10mg  daily at night.  Nasal saline spray (i.e., Simply Saline) or nasal saline lavage (i.e., NeilMed) is recommended as needed and prior to medicated nasal sprays.

## 2021-08-18 NOTE — Assessment & Plan Note (Signed)
Past history - polypectomy in January 2022. Interim history - anosmia unchanged.  Start Xhance (fluticasone) nasal spray 2 sprays per nostril twice a day as needed for nasal congestion.   Sample given and demonstrated proper use.  If this is not covered let us know.

## 2021-08-18 NOTE — Assessment & Plan Note (Addendum)
Past history - Diagnosed with COVID-19 in January 2021 and developed a persistent cough afterwards.  Evaluated by pulmonology and diagnosed with asthma. Up-to-date with COVID-19 vaccination. Interim history - had Covid-19 in July again. Last course of prednisone in October. Had 5 courses this year. CT chest showed nodular densities.    Today's spirometry showed some restriction.  Daily controller medication(s): START Trelegy 291mcg 1 puff once a day and rinse mouth after each use.  Samples given. o STOP Breo for now.  During upper respiratory infections/asthma flares:  o Use albuterol 2 puffs or albuterol nebulizer twice a day for 1-2 weeks until symptoms subside.  o If you need to use your albuterol nebulizer machine back to back within 15-30 minutes with no relief then please go to the ER/urgent care for further evaluation.   May use albuterol rescue inhaler 2 puffs every 4 to 6 hours as needed for shortness of breath, chest tightness, coughing, and wheezing. May use albuterol rescue inhaler 2 puffs 5 to 15 minutes prior to strenuous physical activities. Monitor frequency of use.   Nebulizer machine given and demonstrated proper use.   Get bloodwork to see if qualify for asthma biologics.  Monitor symptoms after taking NSAIDs.  Get spirometry at next visit.  If symptoms not improved by the end of the week - will send in prednisone and/or antibiotics if needed.

## 2021-08-20 ENCOUNTER — Telehealth: Payer: Self-pay | Admitting: *Deleted

## 2021-08-20 NOTE — Telephone Encounter (Signed)
PA has been submitted through CoverMyMeds for [facility] and has been approved. PA has been faxed to pharmacy, labeled, and placed in bulk scanning.

## 2021-08-21 ENCOUNTER — Telehealth: Payer: Self-pay | Admitting: Allergy

## 2021-08-21 MED ORDER — DOXYCYCLINE MONOHYDRATE 100 MG PO TABS: 100.0000 mg | ORAL_TABLET | Freq: Two times a day (BID) | ORAL | 0 refills | Status: DC

## 2021-08-21 MED ORDER — PREDNISONE 10 MG PO TABS: ORAL_TABLET | ORAL | 0 refills | Status: DC

## 2021-08-21 NOTE — Telephone Encounter (Signed)
Patient was seen 08/18/21 in [facility]. She said Dr. Maudie Mercury told her if she wasn't any better by Friday, to call and let her know. She is still coughing and is not any better.

## 2021-08-21 NOTE — Telephone Encounter (Signed)
Please call patient.  Sent in Rx for prednisone and antibiotics.   Take prednisone 40mg  daily x 2 days, 30mg  daily x 2 days, 20mg  daily x 2 days and 10mg  daily x 2 days.  Take doxycycline 100mg  twice a day x 10 days.

## 2021-08-21 NOTE — Telephone Encounter (Signed)
Please advise 

## 2021-08-21 NOTE — Telephone Encounter (Signed)
Patient has been notified that medications have been sent in. She verbalized understanding on how to take both the doxycycline and the prednisone.

## 2021-10-21 NOTE — Progress Notes (Signed)
Follow Up Note  RE: Lindsay Mays MRN: [id number] DOB: August 08, 1976 Date of Office Visit: 10/22/2021  Referring provider: Ma Hillock, DO Primary care provider: Ma Hillock, DO  Chief Complaint: Cough (Yellow-green mucus ) and Allergies (Can smell again)  History of Present Illness: I had the pleasure of seeing Lindsay Mays for a follow up visit at the [facility] on 10/22/2021. She is a 46 y.o. female, who is being followed for asthma, allergic rhinitis, history of nasal polyps, recurrent infections and GERD. Her previous allergy office visit was on 08/18/2021 with Dr. Maudie Mercury. Today is a regular follow up visit.  Asthma: Currently on Trelegy 247mcg 1 puff once a day which has improved some of her symptoms but still having yellowish/greenish mucous on a daily basis with deep coughing.   Using albuterol as needed with good benefit and use has been less now with the trelegy. No fevers/chills.  Finished prednisone and antibiotics and not sure if it helped with the coughing/mucous symptoms.  Used albuterol nebulizer and not sure if it helped.   No bloodwork done yet.    Seasonal allergic rhinitis due to pollen/polyp Taking Singulair 10mg  daily. Taking Xhance 2 sprays per nostril BID with good benefit. Sense of smell is back.   Recurrent infections No extra antibiotics.    Gastroesophageal reflux disease Takes omeprazole and the globus sensation resolved.  Assessment and Plan: Lindsay Mays is a 46 y.o. female with: Post-COVID chronic cough Past history - Diagnosed with COVID-19 in January 2021 and developed a persistent cough afterwards.  Evaluated by pulmonology and diagnosed with asthma. Up-to-date with COVID-19 vaccination. Covid-19 in July 2022 again. CT chest showed nodular densities.  5 courses of prednisone in 2022. Interim history - Trelegy improved wheezing but coughing unchanged. Not sure if nebulizer, prednisone or antibiotics made a difference.  Start  zpak - 2 pills on day 1, 1 pill on day 2-5. Take diflucan if needed for yeast infection. Take mucinex DM twice a day with plenty of water. See plan for asthma as below. If no improvement, will get repeat CXR.  Moderate persistent asthma without complication Past history - Diagnosed with COVID-19 in January 2021 and developed a persistent cough afterwards.  Evaluated by pulmonology and diagnosed with asthma. Up-to-date with COVID-19 vaccination. Covid-19 in July 2022. 5 courses of prednisone in 2022. CT chest showed nodular densities.   Interim history - some improvement but coughing persistent. Less albuterol use.  Today's spirometry showed some restriction - improved from last one.  Daily controller medication(s): stop Trelegy (due to thrush)  START Breztri 2 puffs twice a day with spacer and rinse mouth afterwards. Samples given.  Spacer given and demonstrated proper use with inhaler. Patient understood technique and all questions/concerned were addressed.  During upper respiratory infections/asthma flares:  Use albuterol 2 puffs or albuterol nebulizer twice a day for 1-2 weeks until symptoms subside.  If you need to use your albuterol nebulizer machine back to back within 15-30 minutes with no relief then please go to the ER/urgent care for further evaluation.  May use albuterol rescue inhaler 2 puffs or nebulizer every 4 to 6 hours as needed for shortness of breath, chest tightness, coughing, and wheezing. May use albuterol rescue inhaler 2 puffs 5 to 15 minutes prior to strenuous physical activities. Monitor frequency of use.  Get bloodwork to see if you qualify for an injection for asthma.  Get spirometry at next visit.  Seasonal allergic rhinitis due to  pollen Past history - Rhinoconjunctivitis symptoms for many years mainly in the spring and fall but now worsening since May.  2021 bloodwork was negative. 2021 skin testing showed: Positive to grass pollen only. S/p polypectomy and  deviated septum repair in 2022.  Interim history - stable.  Continue environmental control measures as below. May use over the counter antihistamines such as Zyrtec (cetirizine), Claritin (loratadine), Allegra (fexofenadine), or Xyzal (levocetirizine) daily as needed. May take twice a day if needed. Continue Singulair (montelukast) 10mg  daily at night. Nasal saline spray (i.e., Simply Saline) or nasal saline lavage (i.e., NeilMed) is recommended as needed and prior to medicated nasal sprays.  History of nasal polypectomy Past history - polypectomy in January 2022. Interim history - anosmia resolved.  May take Xhance (fluticasone) nasal spray 1-2 sprays per nostril twice a day as needed for nasal congestion.   Recurrent infections Did not get bloodwork yet.  Keep track of infections. Get bloodwork to look at immune system.  Gastroesophageal reflux disease Globus sensation resolved with omeprazole. Continue lifestyle and dietary modifications. Omeprazole 40mg  daily in the morning. Nothing to eat or drink for 30 minutes.  Oral thrush Take nystatin swish and swallow as prescribed by her dentist previously.  Return in about 4 weeks (around 11/19/2021).  Meds ordered this encounter  Medications   fluconazole (DIFLUCAN) 150 MG tablet    Sig: Take 1 tablet (150 mg total) by mouth daily. For yeast infection    Dispense:  1 tablet    Refill:  0   azithromycin (ZITHROMAX Z-PAK) 250 MG tablet    Sig: Take 2 pills on day 1 and 1 pill from day 2-5.    Dispense:  6 each    Refill:  0   montelukast (SINGULAIR) 10 MG tablet    Sig: Take 1 tablet (10 mg total) by mouth at bedtime.    Dispense:  90 tablet    Refill:  2   omeprazole (PRILOSEC) 40 MG capsule    Sig: Take 1 capsule (40 mg total) by mouth daily.    Dispense:  90 capsule    Refill:  2   Budeson-Glycopyrrol-Formoterol (BREZTRI AEROSPHERE) 160-9-4.8 MCG/ACT AERO    Sig: Inhale 2 puffs into the lungs in the morning and at bedtime.  with spacer and rinse mouth afterwards.    Dispense:  10.7 g    Refill:  0   Lab Orders         Allergens w/Total IgE Area 2         Alpha-1-antitrypsin         ANA w/Reflex         ANCA Profile         CBC with Differential/Platelet         Complement, total         IgG, IgA, IgM         Diphtheria / Tetanus Antibody Panel         Strep pneumoniae 23 Serotypes IgG     Diagnostics: Spirometry:  Tracings reviewed. Her effort: Good reproducible efforts. FVC: 2.76L FEV1: 2.36L, 8% predicted FEV1/FVC ratio: 86% Interpretation: Spirometry consistent with possible restrictive disease.  Please see scanned spirometry results for details.  Medication List:  Current Outpatient Medications  Medication Sig Dispense Refill   albuterol (PROVENTIL) (2.5 MG/3ML) 0.083% nebulizer solution Take 3 mLs (2.5 mg total) by nebulization every 4 (four) hours as needed for wheezing or shortness of breath (coughing fits). 75 mL 2   albuterol (  VENTOLIN HFA) 108 (90 Base) MCG/ACT inhaler Inhale 2 puffs into the lungs every 4 (four) hours as needed for wheezing or shortness of breath (coughing fits). 54 g 0   azithromycin (ZITHROMAX Z-PAK) 250 MG tablet Take 2 pills on day 1 and 1 pill from day 2-5. 6 each 0   Budeson-Glycopyrrol-Formoterol (BREZTRI AEROSPHERE) 160-9-4.8 MCG/ACT AERO Inhale 2 puffs into the lungs in the morning and at bedtime. with spacer and rinse mouth afterwards. 10.7 g 0   fluconazole (DIFLUCAN) 150 MG tablet Take 1 tablet (150 mg total) by mouth daily. For yeast infection 1 tablet 0   Fluticasone Propionate (XHANCE) 93 MCG/ACT EXHU Place 2 sprays into both nostrils 2 (two) times daily. 32 mL 5   levonorgestrel (MIRENA) 20 MCG/24HR IUD by Intrauterine route.     montelukast (SINGULAIR) 10 MG tablet Take 1 tablet (10 mg total) by mouth at bedtime. 90 tablet 2   Multiple Vitamin (MULTIVITAMIN PO) Take 2 tablets by mouth daily at 6 (six) AM.     omeprazole (PRILOSEC) 40 MG capsule Take 1  capsule (40 mg total) by mouth daily. 90 capsule 2   cetirizine (ZYRTEC) 10 MG tablet Take 10 mg by mouth daily as needed for allergies. (Patient not taking: Reported on 10/22/2021)     No current facility-administered medications for this visit.   Allergies: No Known Allergies I reviewed her past medical history, social history, family history, and environmental history and no significant changes have been reported from her previous visit.  Review of Systems  Constitutional:  Negative for appetite change, chills, fever and unexpected weight change.  HENT:  Negative for congestion, rhinorrhea and sneezing.   Eyes:  Negative for itching.  Respiratory:  Positive for cough, chest tightness and shortness of breath. Negative for wheezing.   Cardiovascular:  Negative for chest pain.  Gastrointestinal:  Negative for abdominal pain.  Genitourinary:  Negative for difficulty urinating.  Skin:  Negative for rash.  Allergic/Immunologic: Positive for environmental allergies.  Neurological:  Negative for headaches.   Objective: BP 108/66    Pulse 87    Temp 98.6 F (37 C) (Temporal)    Resp 18    SpO2 97%  There is no height or weight on file to calculate BMI. Physical Exam Vitals and nursing note reviewed.  Constitutional:      Appearance: Normal appearance. She is well-developed.  HENT:     Head: Normocephalic and atraumatic.     Right Ear: Tympanic membrane and external ear normal.     Left Ear: Tympanic membrane and external ear normal.     Nose: Nose normal.     Mouth/Throat:     Mouth: Mucous membranes are moist.     Comments: Whitish spot on the throat. Eyes:     Conjunctiva/sclera: Conjunctivae normal.  Cardiovascular:     Rate and Rhythm: Normal rate and regular rhythm.     Heart sounds: Normal heart sounds. No murmur heard. Pulmonary:     Effort: Pulmonary effort is normal.     Breath sounds: Rhonchi present. No wheezing or rales.  Musculoskeletal:     Cervical back: Neck  supple.  Skin:    General: Skin is warm.     Findings: No rash.  Neurological:     Mental Status: She is alert and oriented to person, place, and time.  Psychiatric:        Behavior: Behavior normal.   Previous notes and tests were reviewed. The plan was reviewed with the  patient/family, and all questions/concerned were addressed.  It was my pleasure to see Mileena today and participate in her care. Please feel free to contact me with any questions or concerns.  Sincerely,  Rexene Alberts, DO Allergy & Immunology  [facility] office: [place] office: [phone]

## 2021-10-22 ENCOUNTER — Encounter: Payer: Self-pay | Admitting: Allergy

## 2021-10-22 ENCOUNTER — Other Ambulatory Visit: Payer: Self-pay

## 2021-10-22 ENCOUNTER — Ambulatory Visit (INDEPENDENT_AMBULATORY_CARE_PROVIDER_SITE_OTHER): Payer: BC Managed Care – PPO | Admitting: Allergy

## 2021-10-22 VITALS — BP 108/66 | HR 87 | Temp 98.6°F | Resp 18

## 2021-10-22 DIAGNOSIS — J4540 Moderate persistent asthma, uncomplicated: Secondary | ICD-10-CM | POA: Diagnosis not present

## 2021-10-22 DIAGNOSIS — J301 Allergic rhinitis due to pollen: Secondary | ICD-10-CM | POA: Diagnosis not present

## 2021-10-22 DIAGNOSIS — B999 Unspecified infectious disease: Secondary | ICD-10-CM

## 2021-10-22 DIAGNOSIS — U099 Post covid-19 condition, unspecified: Secondary | ICD-10-CM | POA: Diagnosis not present

## 2021-10-22 DIAGNOSIS — K219 Gastro-esophageal reflux disease without esophagitis: Secondary | ICD-10-CM

## 2021-10-22 DIAGNOSIS — R053 Chronic cough: Secondary | ICD-10-CM

## 2021-10-22 DIAGNOSIS — B370 Candidal stomatitis: Secondary | ICD-10-CM

## 2021-10-22 DIAGNOSIS — Z8709 Personal history of other diseases of the respiratory system: Secondary | ICD-10-CM

## 2021-10-22 MED ORDER — BREZTRI AEROSPHERE 160-9-4.8 MCG/ACT IN AERO: 2.0000 | INHALATION_SPRAY | Freq: Two times a day (BID) | RESPIRATORY_TRACT | 0 refills | Status: DC

## 2021-10-22 MED ORDER — AZITHROMYCIN 250 MG PO TABS
ORAL_TABLET | ORAL | 0 refills | Status: DC
Start: 2021-10-22 — End: 2021-11-19

## 2021-10-22 MED ORDER — MONTELUKAST SODIUM 10 MG PO TABS: 10.0000 mg | ORAL_TABLET | Freq: Every day | ORAL | 2 refills | Status: DC

## 2021-10-22 MED ORDER — OMEPRAZOLE 40 MG PO CPDR: 40.0000 mg | DELAYED_RELEASE_CAPSULE | Freq: Every day | ORAL | 2 refills | Status: DC

## 2021-10-22 MED ORDER — FLUCONAZOLE 150 MG PO TABS: 150.0000 mg | ORAL_TABLET | Freq: Every day | ORAL | 0 refills | Status: DC

## 2021-10-22 NOTE — Assessment & Plan Note (Signed)
·   Take nystatin swish and swallow as prescribed by her dentist previously.

## 2021-10-22 NOTE — Assessment & Plan Note (Signed)
Globus sensation resolved with omeprazole.  Continue lifestyle and dietary modifications.  Omeprazole 40mg  daily in the morning. Nothing to eat or drink for 30 minutes.

## 2021-10-22 NOTE — Assessment & Plan Note (Addendum)
Past history - Diagnosed with COVID-19 in January 2021 and developed a persistent cough afterwards.  Evaluated by pulmonology and diagnosed with asthma. Up-to-date with COVID-19 vaccination. Covid-19 in July 2022 again. CT chest showed nodular densities.  5 courses of prednisone in 2022. Interim history - Trelegy improved wheezing but coughing unchanged. Not sure if nebulizer, prednisone or antibiotics made a difference.   Start zpak - 2 pills on day 1, 1 pill on day 2-5. o Take diflucan if needed for yeast infection.  Take mucinex DM twice a day with plenty of water.  See plan for asthma as below.  If no improvement, will get repeat CXR.

## 2021-10-22 NOTE — Patient Instructions (Addendum)
Asthma: Start zpak - 2 pills on day 1, 1 pill on day 2-5. Take diflucan if needed for yeast infection. Take mucinex DM twice a day with plenty of water. Take medications for oral thrush.  Daily controller medication(s): stop trelegy START Breztri 2 puffs twice a day with spacer and rinse mouth afterwards. Samples given.  During upper respiratory infections/asthma flares:  Use albuterol 2 puffs or albuterol nebulizer twice a day for 1-2 weeks until symptoms subside.  If you need to use your albuterol nebulizer machine back to back within 15-30 minutes with no relief then please go to the ER/urgent care for further evaluation.  May use albuterol rescue inhaler 2 puffs or nebulizer every 4 to 6 hours as needed for shortness of breath, chest tightness, coughing, and wheezing. May use albuterol rescue inhaler 2 puffs 5 to 15 minutes prior to strenuous physical activities. Monitor frequency of use.  Asthma control goals:  Full participation in all desired activities (may need albuterol before activity) Albuterol use two times or less a week on average (not counting use with activity) Cough interfering with sleep two times or less a month Oral steroids no more than once a year No hospitalizations   Get bloodwork to see if you qualify for an injection for asthma.  We are ordering labs, so please allow 1-2 weeks for the results to come back. With the newly implemented Cures Act, the labs might be visible to you at the same time that they become visible to me. However, I will not address the results until all of the results are back, so please be patient.  In the meantime, continue recommendations in your patient instructions, including avoidance measures (if applicable), until you hear from me.  Environmental allergies 2021 skin testing positive to grass pollen.  Continue environmental control measures as below. May use over the counter antihistamines such as Zyrtec (cetirizine), Claritin  (loratadine), Allegra (fexofenadine), or Xyzal (levocetirizine) daily as needed. May take twice a day if needed. Continue Singulair (montelukast) 10mg  daily at night. Nasal saline spray (i.e., Simply Saline) or nasal saline lavage (i.e., NeilMed) is recommended as needed and prior to medicated nasal sprays.  History of polyps May take Xhance (fluticasone) nasal spray 1-2 sprays per nostril twice a day as needed for nasal congestion.   Heartburn: Continue lifestyle and dietary modifications. Omeprazole 40mg  daily in the morning. Nothing to eat or drink for 30 minutes.  Infections Keep track of infections. Get bloodwork to look at immune system.  Follow up in 1 months or sooner if needed.   Reducing Pollen Exposure Pollen seasons: trees (spring), grass (summer) and ragweed/weeds (fall). Keep windows closed in your home and car to lower pollen exposure.  Install air conditioning in the bedroom and throughout the house if possible.  Avoid going out in dry windy days - especially early morning. Pollen counts are highest between 5 - 10 AM and on dry, hot and windy days.  Save outside activities for late afternoon or after a heavy rain, when pollen levels are lower.  Avoid mowing of grass if you have grass pollen allergy. Be aware that pollen can also be transported indoors on people and pets.  Dry your clothes in an automatic dryer rather than hanging them outside where they might collect pollen.  Rinse hair and eyes before bedtime.

## 2021-10-22 NOTE — Assessment & Plan Note (Signed)
Past history - Diagnosed with COVID-19 in January 2021 and developed a persistent cough afterwards.  Evaluated by pulmonology and diagnosed with asthma. Up-to-date with COVID-19 vaccination. Covid-19 in July 2022. 5 courses of prednisone in 2022. CT chest showed nodular densities.   Interim history - some improvement but coughing persistent. Less albuterol use.   Today's spirometry showed some restriction - improved from last one.   Daily controller medication(s): stop Trelegy (due to thrush)  o START Breztri 2 puffs twice a day with spacer and rinse mouth afterwards. Samples given.  Spacer given and demonstrated proper use with inhaler. Patient understood technique and all questions/concerned were addressed.   During upper respiratory infections/asthma flares:  o Use albuterol 2 puffs or albuterol nebulizer twice a day for 1-2 weeks until symptoms subside.  o If you need to use your albuterol nebulizer machine back to back within 15-30 minutes with no relief then please go to the ER/urgent care for further evaluation.   May use albuterol rescue inhaler 2 puffs or nebulizer every 4 to 6 hours as needed for shortness of breath, chest tightness, coughing, and wheezing. May use albuterol rescue inhaler 2 puffs 5 to 15 minutes prior to strenuous physical activities. Monitor frequency of use.   Get bloodwork to see if you qualify for an injection for asthma.   Get spirometry at next visit.

## 2021-10-22 NOTE — Assessment & Plan Note (Signed)
Past history - polypectomy in January 2022. Interim history - anosmia resolved.   May take Xhance (fluticasone) nasal spray 1-2 sprays per nostril twice a day as needed for nasal congestion.

## 2021-10-22 NOTE — Assessment & Plan Note (Signed)
Past history - Rhinoconjunctivitis symptoms for many years mainly in the spring and fall but now worsening since May.  2021 bloodwork was negative. 2021 skin testing showed: Positive to grass pollen only. S/p polypectomy and deviated septum repair in 2022.  Interim history - stable.   Continue environmental control measures as below.  May use over the counter antihistamines such as Zyrtec (cetirizine), Claritin (loratadine), Allegra (fexofenadine), or Xyzal (levocetirizine) daily as needed. May take twice a day if needed.  Continue Singulair (montelukast) 10mg  daily at night.  Nasal saline spray (i.e., Simply Saline) or nasal saline lavage (i.e., NeilMed) is recommended as needed and prior to medicated nasal sprays.

## 2021-10-22 NOTE — Assessment & Plan Note (Signed)
Did not get bloodwork yet.   Keep track of infections.  Get bloodwork to look at immune system.

## 2021-10-26 DIAGNOSIS — B999 Unspecified infectious disease: Secondary | ICD-10-CM | POA: Diagnosis not present

## 2021-10-26 DIAGNOSIS — J4540 Moderate persistent asthma, uncomplicated: Secondary | ICD-10-CM | POA: Diagnosis not present

## 2021-10-26 DIAGNOSIS — J301 Allergic rhinitis due to pollen: Secondary | ICD-10-CM | POA: Diagnosis not present

## 2021-10-31 LAB — CBC WITH DIFFERENTIAL/PLATELET
Basophils Absolute: 0.1 10*3/uL (ref 0.0–0.2)
Basos: 1 %
EOS (ABSOLUTE): 0.1 10*3/uL (ref 0.0–0.4)
Eos: 1 %
Hematocrit: 42.4 % (ref 34.0–46.6)
Hemoglobin: 14.5 g/dL (ref 11.1–15.9)
Immature Grans (Abs): 0 10*3/uL (ref 0.0–0.1)
Immature Granulocytes: 0 %
Lymphocytes Absolute: 2.5 10*3/uL (ref 0.7–3.1)
Lymphs: 24 %
MCH: 29.9 pg (ref 26.6–33.0)
MCHC: 34.2 g/dL (ref 31.5–35.7)
MCV: 87 fL (ref 79–97)
Monocytes Absolute: 0.6 10*3/uL (ref 0.1–0.9)
Monocytes: 6 %
Neutrophils Absolute: 7.3 10*3/uL — ABNORMAL HIGH (ref 1.4–7.0)
Neutrophils: 68 %
Platelets: 408 10*3/uL (ref 150–450)
RBC: 4.85 x10E6/uL (ref 3.77–5.28)
RDW: 12.1 % (ref 11.7–15.4)
WBC: 10.6 10*3/uL (ref 3.4–10.8)

## 2021-10-31 LAB — ALLERGENS W/TOTAL IGE AREA 2
Alternaria Alternata IgE: <0.1 kU/L
Aspergillus Fumigatus IgE: <0.1 kU/L
Bermuda Grass IgE: <0.1 kU/L
Cat Dander IgE: <0.1 kU/L
Cedar, Mountain IgE: <0.1 kU/L
Cladosporium Herbarum IgE: <0.1 kU/L
Cockroach, German IgE: <0.1 kU/L
Common Silver Birch IgE: <0.1 kU/L
Cottonwood IgE: <0.1 kU/L
D Farinae IgE: <0.1 kU/L
D Pteronyssinus IgE: <0.1 kU/L
Dog Dander IgE: <0.1 kU/L
Elm, American IgE: <0.1 kU/L
IgE (Immunoglobulin E), Serum: 116 IU/mL (ref 6–495)
Johnson Grass IgE: <0.1 kU/L
Maple/Box Elder IgE: 0.18 kU/L — AB
Mouse Urine IgE: <0.1 kU/L
Oak, White IgE: <0.1 kU/L
Pecan, Hickory IgE: <0.1 kU/L
Penicillium Chrysogen IgE: <0.1 kU/L
Pigweed, Rough IgE: <0.1 kU/L
Ragweed, Short IgE: <0.1 kU/L
Sheep Sorrel IgE Qn: <0.1 kU/L
Timothy Grass IgE: <0.1 kU/L
White Mulberry IgE: <0.1 kU/L

## 2021-10-31 LAB — STREP PNEUMONIAE 23 SEROTYPES IGG
Pneumo Ab Type 1*: <0.1 ug/mL — ABNORMAL LOW (ref >1.3)
Pneumo Ab Type 12 (12F)*: <0.1 ug/mL — ABNORMAL LOW (ref >1.3)
Pneumo Ab Type 14*: <0.1 ug/mL — ABNORMAL LOW (ref >1.3)
Pneumo Ab Type 17 (17F)*: <0.1 ug/mL — ABNORMAL LOW (ref >1.3)
Pneumo Ab Type 19 (19F)*: 0.6 ug/mL — ABNORMAL LOW (ref >1.3)
Pneumo Ab Type 2*: <0.1 ug/mL — ABNORMAL LOW (ref >1.3)
Pneumo Ab Type 20*: <0.1 ug/mL — ABNORMAL LOW (ref >1.3)
Pneumo Ab Type 22 (22F)*: <0.1 ug/mL — ABNORMAL LOW (ref >1.3)
Pneumo Ab Type 23 (23F)*: <0.1 ug/mL — ABNORMAL LOW (ref >1.3)
Pneumo Ab Type 26 (6B)*: <0.1 ug/mL — ABNORMAL LOW (ref >1.3)
Pneumo Ab Type 3*: <0.1 ug/mL — ABNORMAL LOW (ref >1.3)
Pneumo Ab Type 34 (10A)*: <0.1 ug/mL — ABNORMAL LOW (ref >1.3)
Pneumo Ab Type 4*: <0.1 ug/mL — ABNORMAL LOW (ref >1.3)
Pneumo Ab Type 43 (11A)*: 0.1 ug/mL — ABNORMAL LOW (ref >1.3)
Pneumo Ab Type 5*: <0.1 ug/mL — ABNORMAL LOW (ref >1.3)
Pneumo Ab Type 51 (7F)*: 0.3 ug/mL — ABNORMAL LOW (ref >1.3)
Pneumo Ab Type 54 (15B)*: <0.1 ug/mL — ABNORMAL LOW (ref >1.3)
Pneumo Ab Type 56 (18C)*: <0.1 ug/mL — ABNORMAL LOW (ref >1.3)
Pneumo Ab Type 57 (19A)*: 0.1 ug/mL — ABNORMAL LOW (ref >1.3)
Pneumo Ab Type 68 (9V)*: <0.1 ug/mL — ABNORMAL LOW (ref >1.3)
Pneumo Ab Type 70 (33F)*: 0.2 ug/mL — ABNORMAL LOW (ref >1.3)
Pneumo Ab Type 8*: <0.1 ug/mL — ABNORMAL LOW (ref >1.3)
Pneumo Ab Type 9 (9N)*: <0.1 ug/mL — ABNORMAL LOW (ref >1.3)

## 2021-10-31 LAB — ANCA PROFILE
Anti-MPO Antibodies: <0.2 units (ref 0.0–0.9)
Anti-PR3 Antibodies: <0.2 units (ref 0.0–0.9)
Atypical pANCA: <1:20 {titer}
C-ANCA: <1:20 {titer}
P-ANCA: <1:20 {titer}

## 2021-10-31 LAB — IGG, IGA, IGM
IgA/Immunoglobulin A, Serum: 181 mg/dL (ref 87–352)
IgG (Immunoglobin G), Serum: 1612 mg/dL — ABNORMAL HIGH (ref 586–1602)
IgM (Immunoglobulin M), Srm: 302 mg/dL — ABNORMAL HIGH (ref 26–217)

## 2021-10-31 LAB — DIPHTHERIA / TETANUS ANTIBODY PANEL
Diphtheria Ab: 0.42 IU/mL (ref <0.10)
Tetanus Ab, IgG: 1.41 IU/mL (ref <0.10)

## 2021-10-31 LAB — ALPHA-1-ANTITRYPSIN: A-1 Antitrypsin: 148 mg/dL (ref 101–187)

## 2021-10-31 LAB — COMPLEMENT, TOTAL: Compl, Total (CH50): >60 U/mL (ref >41)

## 2021-10-31 LAB — ANA W/REFLEX: Anti Nuclear Antibody (ANA): NEGATIVE

## 2021-11-18 NOTE — Progress Notes (Signed)
? ?Follow Up Note ? ?RE: Lindsay Mays MRN: [id number] DOB: 14-Apr-1976 ?Date of Office Visit: 11/19/2021 ? ?Referring provider: Ma Hillock, DO ?Primary care provider: Ma Hillock, DO ? ?Chief Complaint: Asthma (Cough is the same ) ? ?History of Present Illness: ?I had the pleasure of seeing Lindsay Mays for a follow up visit at the [facility] on 11/19/2021. She is a 46 y.o. female, who is being followed for post-COVID cough, asthma, allergic rhinitis, history of nasal polypectomy, recurrent infections, GERD. Her previous allergy office visit was on 10/22/2021 with Dr. Maudie Mays. Today is a regular follow up visit. ? ?Post-COVID chronic cough ?Coughing is unchanged. ?Took zpak and mucinex with no benefit.  ? ?Asthma  ?Likes Breztri 2 puffs twice a day and it seems to control symptoms just as well as trelegy. ?No albuterol use. ?No prednisone.  ?Willing to try biologics.  ? ?Seasonal allergic rhinitis due to pollen ?Currently taking singulair daily. ?Tried antihistamines with no benefit. ?  ?History of nasal polypectomy ?Taking Xhance (fluticasone) nasal spray 2 sprays per nostril twice a day.   ?  ?Recurrent infections ?No additional antibiotics. ?Did not get pneumonia vaccine yet. ? ?Gastroesophageal reflux disease ?Taking Omeprazole '40mg'$  daily in the morning. ? ?Assessment and Plan: ?Lindsay Mays is a 46 y.o. female with: ?Post-COVID chronic cough ?Past history - Diagnosed with COVID-19 in January 2021 and developed a persistent cough afterwards.  Evaluated by pulmonology and diagnosed with asthma. Up-to-date with COVID-19 vaccination. Covid-19 in July 2022 again. CT chest showed nodular densities.  5 courses of prednisone in 2022. ?Interim history - unchanged with Zpak and Mucinex. Bloodwork was negative to alpha-1 antitrypsin, ANA, and ANCA. ?Get CXR and will make recommendations based on results.  ? ?Not well controlled moderate persistent asthma ?Past history - Diagnosed with COVID-19 in January  2021 and developed a persistent cough afterwards.  Evaluated by pulmonology and diagnosed with asthma. Up-to-date with COVID-19 vaccination. Covid-19 in July 2022. 5 courses of prednisone in 2022. CT chest showed nodular densities.   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 Coughing unchanged. Eos 100, Ige 116.   ?Today's spirometry was normal.  ?Will start Prior Authorization process for biologics (preference is Tezspire or Dupixent given other clinical history). ?Daily controller medication(s):  ?Breztri 2 puffs twice a day with spacer and rinse mouth afterwards. Samples given.  ?Let us know if not covered.  ?During upper respiratory infections/asthma flares:  ?Use albuterol 2 puffs or albuterol nebulizer twice a day for 1-2 weeks until symptoms subside.  ?If you need to use your albuterol nebulizer machine back to back within 15-30 minutes with no relief then please go to the ER/urgent care for further evaluation.  ?May use albuterol rescue inhaler 2 puffs or nebulizer every 4 to 6 hours as needed for shortness of breath, chest tightness, coughing, and wheezing. May use albuterol rescue inhaler 2 puffs 5 to 15 minutes prior to strenuous physical activities. Monitor frequency of use.  ?Get spirometry at next visit. ? ?Recurrent infections ?Normal immunoglobulin levels, low pneumococcal titers.  ?Keep track of infections. ?Get pneumonia vaccine.  ?Get bloodwork 4 weeks after vaccine.  ? ?Gastroesophageal reflux disease ?Past history - Globus sensation resolved with omeprazole. ?Continue lifestyle and dietary modifications. ?Omeprazole '40mg'$  daily in the morning. Nothing to eat or drink for 30 minutes. ? ?History of nasal polypectomy ?Past history - polypectomy in January 2022. ?Xhance (fluticasone) nasal spray 1-2 sprays per nostril twice a day as needed  for nasal congestion.  ? ?Seasonal allergic rhinitis due to pollen ?Past history - Rhinoconjunctivitis symptoms for many years mainly in the spring and fall  but now worsening since May.  2021 bloodwork was negative. 2021 skin testing showed: Positive to grass pollen only. S/p polypectomy and deviated septum repair in 2022.  ?Interim history - 2023 bloodwork negative to environmental allergy panel.  ?Continue environmental control measures as below. ?May use over the counter antihistamines such as Zyrtec (cetirizine), Claritin (loratadine), Allegra (fexofenadine), or Xyzal (levocetirizine) daily as needed. May take twice a day if needed. ?Continue Singulair (montelukast) '10mg'$  daily at night. ?Nasal saline spray (i.e., Simply Saline) or nasal saline lavage (i.e., NeilMed) is recommended as needed and prior to medicated nasal sprays. ?Continue Xhance as above for nasal polyps. ? ?Return in about 2 months (around 01/19/2022). ? ?Meds ordered this encounter  ?Medications  ? Budeson-Glycopyrrol-Formoterol (BREZTRI AEROSPHERE) 160-9-4.8 MCG/ACT AERO  ?  Sig: Inhale 2 puffs into the lungs in the morning and at bedtime. with spacer and rinse mouth afterwards.  ?  Dispense:  32.1 g  ?  Refill:  2  ? pneumococcal 23 valent vaccine (PNEUMOVAX 23) 25 MCG/0.5ML injection  ?  Sig: Inject 0.5 mLs into the muscle once for 1 dose.  ?  Dispense:  2.5 mL  ?  Refill:  0  ? ?Lab Orders    ?     Strep pneumoniae 23 Serotypes IgG    ? ? ?Diagnostics: ?Spirometry:  ?Tracings reviewed. Her effort: Good reproducible efforts. ?FVC: 3.13L ?FEV1: 2.51L, 85% predicted ?FEV1/FVC ratio: 80% ?Interpretation: Spirometry consistent with normal pattern.  ?Please see scanned spirometry results for details. ? ?Medication List:  ?Current Outpatient Medications  ?Medication Sig Dispense Refill  ? albuterol (PROVENTIL) (2.5 MG/3ML) 0.083% nebulizer solution Take 3 mLs (2.5 mg total) by nebulization every 4 (four) hours as needed for wheezing or shortness of breath (coughing fits). 75 mL 2  ? albuterol (VENTOLIN HFA) 108 (90 Base) MCG/ACT inhaler Inhale 2 puffs into the lungs every 4 (four) hours as needed for  wheezing or shortness of breath (coughing fits). 54 g 0  ? Budeson-Glycopyrrol-Formoterol (BREZTRI AEROSPHERE) 160-9-4.8 MCG/ACT AERO Inhale 2 puffs into the lungs in the morning and at bedtime. with spacer and rinse mouth afterwards. 32.1 g 2  ? cetirizine (ZYRTEC) 10 MG tablet Take 10 mg by mouth daily as needed for allergies.    ? Fluticasone Propionate (XHANCE) 93 MCG/ACT EXHU Place 2 sprays into both nostrils 2 (two) times daily. 32 mL 5  ? levonorgestrel (MIRENA) 20 MCG/24HR IUD by Intrauterine route.    ? montelukast (SINGULAIR) 10 MG tablet Take 1 tablet (10 mg total) by mouth at bedtime. 90 tablet 2  ? Multiple Vitamin (MULTIVITAMIN PO) Take 2 tablets by mouth daily at 6 (six) AM.    ? omeprazole (PRILOSEC) 40 MG capsule Take 1 capsule (40 mg total) by mouth daily. 90 capsule 2  ? pneumococcal 23 valent vaccine (PNEUMOVAX 23) 25 MCG/0.5ML injection Inject 0.5 mLs into the muscle once for 1 dose. 2.5 mL 0  ? ?No current facility-administered medications for this visit.  ? ?Allergies: ?No Known Allergies ?I reviewed her past medical history, social history, family history, and environmental history and no significant changes have been reported from her previous visit. ? ?Review of Systems  ?Constitutional:  Negative for appetite change, chills, fever and unexpected weight change.  ?HENT:  Negative for congestion, rhinorrhea and sneezing.   ?Eyes:  Negative for itching.  ?  Respiratory:  Positive for cough. Negative for chest tightness, shortness of breath and wheezing.   ?Cardiovascular:  Negative for chest pain.  ?Gastrointestinal:  Negative for abdominal pain.  ?Genitourinary:  Negative for difficulty urinating.  ?Skin:  Negative for rash.  ?Allergic/Immunologic: Positive for environmental allergies.  ?Neurological:  Negative for headaches.  ? ?Objective: ?BP 92/64   Pulse 86   Resp 20   SpO2 98%  ?There is no height or weight on file to calculate BMI. ?Physical Exam ?Vitals and nursing note reviewed.   ?Constitutional:   ?   Appearance: Normal appearance. She is well-developed.  ?HENT:  ?   Head: Normocephalic and atraumatic.  ?   Right Ear: Tympanic membrane and external ear normal.  ?   Left Ear: Tympan

## 2021-11-19 ENCOUNTER — Other Ambulatory Visit: Payer: Self-pay

## 2021-11-19 ENCOUNTER — Ambulatory Visit (INDEPENDENT_AMBULATORY_CARE_PROVIDER_SITE_OTHER): Payer: BC Managed Care – PPO | Admitting: Allergy

## 2021-11-19 ENCOUNTER — Encounter: Payer: Self-pay | Admitting: Allergy

## 2021-11-19 VITALS — BP 92/64 | HR 86 | Resp 20

## 2021-11-19 DIAGNOSIS — B999 Unspecified infectious disease: Secondary | ICD-10-CM

## 2021-11-19 DIAGNOSIS — J4540 Moderate persistent asthma, uncomplicated: Secondary | ICD-10-CM | POA: Diagnosis not present

## 2021-11-19 DIAGNOSIS — R059 Cough, unspecified: Secondary | ICD-10-CM | POA: Diagnosis not present

## 2021-11-19 DIAGNOSIS — J301 Allergic rhinitis due to pollen: Secondary | ICD-10-CM | POA: Diagnosis not present

## 2021-11-19 DIAGNOSIS — Z8709 Personal history of other diseases of the respiratory system: Secondary | ICD-10-CM

## 2021-11-19 DIAGNOSIS — R053 Chronic cough: Secondary | ICD-10-CM | POA: Diagnosis not present

## 2021-11-19 DIAGNOSIS — K219 Gastro-esophageal reflux disease without esophagitis: Secondary | ICD-10-CM | POA: Diagnosis not present

## 2021-11-19 DIAGNOSIS — U099 Post covid-19 condition, unspecified: Secondary | ICD-10-CM

## 2021-11-19 DIAGNOSIS — R918 Other nonspecific abnormal finding of lung field: Secondary | ICD-10-CM | POA: Diagnosis not present

## 2021-11-19 MED ORDER — BREZTRI AEROSPHERE 160-9-4.8 MCG/ACT IN AERO: 2.0000 | INHALATION_SPRAY | Freq: Two times a day (BID) | RESPIRATORY_TRACT | 2 refills | Status: DC

## 2021-11-19 MED ORDER — PNEUMOVAX 23 25 MCG/0.5ML IJ INJ: 0.5000 mL | INJECTION | Freq: Once | INTRAMUSCULAR | 0 refills | Status: AC

## 2021-11-19 NOTE — Assessment & Plan Note (Signed)
Past history - polypectomy in January 2022. ?? Xhance (fluticasone) nasal spray 1-2 sprays per nostril twice a day as needed for nasal congestion.  ?

## 2021-11-19 NOTE — Assessment & Plan Note (Signed)
Past history - Diagnosed with COVID-19 in January 2021 and developed a persistent cough afterwards.  Evaluated by pulmonology and diagnosed with asthma. Up-to-date with COVID-19 vaccination. Covid-19 in July 2022. 5 courses of prednisone in 2022. CT chest showed nodular densities.   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 Coughing unchanged. Eos 100, Ige 116.   ?? Today's spirometry was normal.  ?? Will start Prior Authorization process for biologics (preference is Tezspire or Dupixent given other clinical history). ?? Daily controller medication(s):  ?o Breztri 2 puffs twice a day with spacer and rinse mouth afterwards. Samples given.  ?- Let us know if not covered.  ?? During upper respiratory infections/asthma flares:  ?o Use albuterol 2 puffs or albuterol nebulizer twice a day for 1-2 weeks until symptoms subside.  ?o If you need to use your albuterol nebulizer machine back to back within 15-30 minutes with no relief then please go to the ER/urgent care for further evaluation.  ?? May use albuterol rescue inhaler 2 puffs or nebulizer every 4 to 6 hours as needed for shortness of breath, chest tightness, coughing, and wheezing. May use albuterol rescue inhaler 2 puffs 5 to 15 minutes prior to strenuous physical activities. Monitor frequency of use.  ?? Get spirometry at next visit. ?

## 2021-11-19 NOTE — Assessment & Plan Note (Signed)
Normal immunoglobulin levels, low pneumococcal titers.  ?? Keep track of infections. ?? Get pneumonia vaccine.  ?o Get bloodwork 4 weeks after vaccine.  ?

## 2021-11-19 NOTE — Patient Instructions (Addendum)
Asthma: ?Will start Prior Authorization process for injectable. ? ?Get CXR. ?Will make recommendations based on results.  ?Daily controller medication(s):  ?Breztri 2 puffs twice a day with spacer and rinse mouth afterwards. Samples given.  ?Let us know if not covered.  ?During upper respiratory infections/asthma flares:  ?Use albuterol 2 puffs or albuterol nebulizer twice a day for 1-2 weeks until symptoms subside.  ?If you need to use your albuterol nebulizer machine back to back within 15-30 minutes with no relief then please go to the ER/urgent care for further evaluation.  ?May use albuterol rescue inhaler 2 puffs or nebulizer every 4 to 6 hours as needed for shortness of breath, chest tightness, coughing, and wheezing. May use albuterol rescue inhaler 2 puffs 5 to 15 minutes prior to strenuous physical activities. Monitor frequency of use.  ?Asthma control goals:  ?Full participation in all desired activities (may need albuterol before activity) ?Albuterol use two times or less a week on average (not counting use with activity) ?Cough interfering with sleep two times or less a month ?Oral steroids no more than once a year ?No hospitalizations  ? ?Environmental allergies ?2021 skin testing positive to grass pollen.  ?Continue environmental control measures as below. ?May use over the counter antihistamines such as Zyrtec (cetirizine), Claritin (loratadine), Allegra (fexofenadine), or Xyzal (levocetirizine) daily as needed. May take twice a day if needed. ?Continue Singulair (montelukast) '10mg'$  daily at night. ?Nasal saline spray (i.e., Simply Saline) or nasal saline lavage (i.e., NeilMed) is recommended as needed and prior to medicated nasal sprays. ? ?History of polyps ?May take Xhance (fluticasone) nasal spray 1-2 sprays per nostril twice a day as needed for nasal congestion.  ? ?Heartburn: ?Continue lifestyle and dietary modifications. ?Omeprazole '40mg'$  daily in the morning. Nothing to eat or drink for 30  minutes. ? ?Infections ?Keep track of infections. ?Get pneumonia vaccine.  ?Get bloodwork 4 weeks after vaccine.  ? ?Follow up in 2 months or sooner if needed.  ? ?Reducing Pollen Exposure ?Pollen seasons: trees (spring), grass (summer) and ragweed/weeds (fall). ?Keep windows closed in your home and car to lower pollen exposure.  ?Install air conditioning in the bedroom and throughout the house if possible.  ?Avoid going out in dry windy days - especially early morning. ?Pollen counts are highest between 5 - 10 AM and on dry, hot and windy days.  ?Save outside activities for late afternoon or after a heavy rain, when pollen levels are lower.  ?Avoid mowing of grass if you have grass pollen allergy. ?Be aware that pollen can also be transported indoors on people and pets.  ?Dry your clothes in an automatic dryer rather than hanging them outside where they might collect pollen.  ?Rinse hair and eyes before bedtime. ? ? ?

## 2021-11-19 NOTE — Assessment & Plan Note (Signed)
Past history - Globus sensation resolved with omeprazole. Continue lifestyle and dietary modifications. Omeprazole 40mg daily in the morning. Nothing to eat or drink for 30 minutes. 

## 2021-11-19 NOTE — Assessment & Plan Note (Signed)
Past history - Diagnosed with COVID-19 in January 2021 and developed a persistent cough afterwards.  Evaluated by pulmonology and diagnosed with asthma. Up-to-date with COVID-19 vaccination. Covid-19 in July 2022 again. CT chest showed nodular densities.  5 courses of prednisone in 2022. ?Interim history - unchanged with Zpak and Mucinex. Bloodwork was negative to alpha-1 antitrypsin, ANA, and ANCA. ?? Get CXR and will make recommendations based on results.  ?

## 2021-11-19 NOTE — Assessment & Plan Note (Signed)
Past history - Rhinoconjunctivitis symptoms for many years mainly in the spring and fall but now worsening since May.  2021 bloodwork was negative. 2021 skin testing showed: Positive to grass pollen only. S/p polypectomy and deviated septum repair in 2022.  ?Interim history - 2023 bloodwork negative to environmental allergy panel.  ?? Continue environmental control measures as below. ?? May use over the counter antihistamines such as Zyrtec (cetirizine), Claritin (loratadine), Allegra (fexofenadine), or Xyzal (levocetirizine) daily as needed. May take twice a day if needed. ?? Continue Singulair (montelukast) '10mg'$  daily at night. ?? Nasal saline spray (i.e., Simply Saline) or nasal saline lavage (i.e., NeilMed) is recommended as needed and prior to medicated nasal sprays. ?? Continue Xhance as above for nasal polyps. ?

## 2021-11-23 ENCOUNTER — Telehealth: Payer: Self-pay | Admitting: *Deleted

## 2021-11-23 ENCOUNTER — Telehealth: Payer: Self-pay | Admitting: Allergy

## 2021-11-23 ENCOUNTER — Other Ambulatory Visit: Payer: Self-pay | Admitting: *Deleted

## 2021-11-23 MED ORDER — FLUCONAZOLE 150 MG PO TABS: 150.0000 mg | ORAL_TABLET | Freq: Once | ORAL | 0 refills | Status: AC

## 2021-11-23 MED ORDER — LEVOFLOXACIN 750 MG PO TABS: 750.0000 mg | ORAL_TABLET | Freq: Every day | ORAL | 0 refills | Status: DC

## 2021-11-23 NOTE — Telephone Encounter (Signed)
Please call patient. ? ?Chest x-ray came back as there is some mild fluid/infection in the lower lungs. ? ?I'm going to prescribe a stronger antibiotics that I want her to take. Levaquin '750mg'$  once a day for 7 days. Make sure to take with probiotics. If having issues with diarrhea let us know and stop medication. ? ?No exercise or lifting heavy things with antibiotics as there is a side effect of tendonitis and other tendon issues with this drug.  ? ?We need to repeat the chest x-ray in 3-4 weeks to make sure it has cleared up. ? ?She needs to go see pulmonology as well. ? ?

## 2021-11-23 NOTE — Addendum Note (Signed)
Addended by: Garnet Sierras on: 11/23/2021 12:23 PM ? ? Modules accepted: Orders ? ?

## 2021-11-23 NOTE — Telephone Encounter (Signed)
-----   Message from Garnet Sierras, DO sent at 11/19/2021  5:00 PM EDT ----- ?Lindsay Mays, can you check if any of the biologics would be covered for her? Ideally I would like to try Tezspire or Dupixent. IgE 116 (but not really allergic to anything so not Xolair), Eos only 100 but has history of polypectomy. Thanks.  ?

## 2021-11-23 NOTE — Telephone Encounter (Signed)
L/m for patient to contact me to discuss Tezspire for her asthma and will need prescription card for approval ?

## 2021-11-23 NOTE — Telephone Encounter (Signed)
Diflucan sent in

## 2021-11-23 NOTE — Telephone Encounter (Signed)
Called and spoke with patient and advised of chest x ray results and antibiotic being sent in. She asked if Diflucan can please be sent in for her as well.  ?

## 2021-12-08 NOTE — Telephone Encounter (Signed)
Spoke to patient and got her Rx card info. Will work on approval and reach out to patient to discuss next steps ?

## 2021-12-09 DIAGNOSIS — Z1231 Encounter for screening mammogram for malignant neoplasm of breast: Secondary | ICD-10-CM | POA: Diagnosis not present

## 2021-12-09 DIAGNOSIS — R928 Other abnormal and inconclusive findings on diagnostic imaging of breast: Secondary | ICD-10-CM | POA: Diagnosis not present

## 2021-12-16 DIAGNOSIS — R928 Other abnormal and inconclusive findings on diagnostic imaging of breast: Secondary | ICD-10-CM | POA: Diagnosis not present

## 2021-12-16 DIAGNOSIS — R922 Inconclusive mammogram: Secondary | ICD-10-CM | POA: Diagnosis not present

## 2021-12-16 LAB — HM MAMMOGRAPHY

## 2021-12-17 NOTE — Telephone Encounter (Signed)
Called patient and advised of approval and she advised at this time her asthma is doing well and does not feel she wants to add therapy. She has followup soon with Dr Maudie Mercury and will discuss same.  I advised if she does feel seh wants to add-on therapy at later date we can revisit Tezspire ?

## 2021-12-17 NOTE — Telephone Encounter (Signed)
Noted  

## 2022-02-11 ENCOUNTER — Telehealth: Payer: Self-pay

## 2022-02-11 NOTE — Telephone Encounter (Signed)
A user error has taken place: encounter opened in error, closed for administrative reasons.

## 2022-02-12 ENCOUNTER — Encounter: Payer: Self-pay | Admitting: Family Medicine

## 2022-02-12 ENCOUNTER — Ambulatory Visit (INDEPENDENT_AMBULATORY_CARE_PROVIDER_SITE_OTHER): Payer: BC Managed Care – PPO | Admitting: Family Medicine

## 2022-02-12 VITALS — BP 100/65 | HR 67 | Temp 97.7°F | Ht 65.0 in | Wt 152.0 lb

## 2022-02-12 DIAGNOSIS — K649 Unspecified hemorrhoids: Secondary | ICD-10-CM | POA: Diagnosis not present

## 2022-02-12 MED ORDER — HYDROCORTISONE (PERIANAL) 2.5 % EX CREA: 1.0000 "application " | TOPICAL_CREAM | Freq: Two times a day (BID) | CUTANEOUS | 2 refills | Status: DC

## 2022-02-12 MED ORDER — HYDROCORTISONE ACETATE 25 MG RE SUPP
25.0000 mg | Freq: Two times a day (BID) | RECTAL | 2 refills | Status: DC
Start: 2022-02-12 — End: 2022-02-26

## 2022-02-12 NOTE — Patient Instructions (Addendum)
Hemorrhoids Hemorrhoids are swollen veins that may develop: In the butt (rectum). These are called internal hemorrhoids. Around the opening of the butt (anus). These are called external hemorrhoids. Hemorrhoids can cause pain, itching, or bleeding. Most of the time, they do not cause serious problems. They usually get better with diet changes, lifestyle changes, and other home treatments. What are the causes? This condition may be caused by: Having trouble pooping (constipation). Pushing hard (straining) to poop. Watery poop (diarrhea). Pregnancy. Being very overweight (obese). Sitting for long periods of time. Heavy lifting or other activity that causes you to strain. Anal sex. Riding a bike for a long period of time. What are the signs or symptoms? Symptoms of this condition include: Pain. Itching or soreness in the butt. Bleeding from the butt. Leaking poop. Swelling in the area. One or more lumps around the opening of your butt. How is this diagnosed? A doctor can often diagnose this condition by looking at the affected area. The doctor may also: Do an exam that involves feeling the area with a gloved hand (digital rectal exam). Examine the area inside your butt using a small tube (anoscope). Order blood tests. This may be done if you have lost a lot of blood. Have you get a test that involves looking inside the colon using a flexible tube with a camera on the end (sigmoidoscopy or colonoscopy). How is this treated? This condition can usually be treated at home. Your doctor may tell you to change what you eat, make lifestyle changes, or try home treatments. If these do not help, procedures can be done to remove the hemorrhoids or make them smaller. These may involve: Placing rubber bands at the base of the hemorrhoids to cut off their blood supply. Injecting medicine into the hemorrhoids to shrink them. Shining a type of light energy onto the hemorrhoids to cause them to fall  off. Doing surgery to remove the hemorrhoids or cut off their blood supply. Follow these instructions at home: Eating and drinking  Eat foods that have a lot of fiber in them. These include whole grains, beans, nuts, fruits, and vegetables. Ask your doctor about taking products that have added fiber (fibersupplements). Reduce the amount of fat in your diet. You can do this by: Eating low-fat dairy products. Eating less red meat. Avoiding processed foods. Drink enough fluid to keep your pee (urine) pale yellow. Managing pain and swelling  Take a warm-water bath (sitz bath) for 20 minutes to ease pain. Do this 3-4 times a day. You may do this in a bathtub or using a portable sitz bath that fits over the toilet. If told, put ice on the painful area. It may be helpful to use ice between your warm baths. Put ice in a plastic bag. Place a towel between your skin and the bag. Leave the ice on for 20 minutes, 2-3 times a day. General instructions Take over-the-counter and prescription medicines only as told by your doctor. Medicated creams and medicines may be used as told. Exercise often. Ask your doctor how much and what kind of exercise is best for you. Go to the bathroom when you have the urge to poop. Do not wait. Avoid pushing too hard when you poop. Keep your butt dry and clean. Use wet toilet paper or moist towelettes after pooping. Do not sit on the toilet for a long time. Keep all follow-up visits as told by your doctor. This is important. Contact a doctor if you: Have pain and   swelling that do not get better with treatment or medicine. Have trouble pooping. Cannot poop. Have pain or swelling outside the area of the hemorrhoids. Get help right away if you have: Bleeding that will not stop. Summary Hemorrhoids are swollen veins in the butt or around the opening of the butt. They can cause pain, itching, or bleeding. Eat foods that have a lot of fiber in them. These include  whole grains, beans, nuts, fruits, and vegetables. Take a warm-water bath (sitz bath) for 20 minutes to ease pain. Do this 3-4 times a day. This information is not intended to replace advice given to you by your health care provider. Make sure you discuss any questions you have with your health care provider. Document Revised: 03/04/2021 Document Reviewed: 03/04/2021 [employer]vier Patient Education  2023 [employer]  

## 2022-02-12 NOTE — Progress Notes (Signed)
Lindsay Mays , 07-04-1976, 46 y.o., female MRN: [id number] Patient Care Team    Relationship Specialty Notifications Start End  Ma Hillock, DO PCP - General Family Medicine  03/10/21   Charlaine Dalton, MD Referring Physician   03/10/21   Malachi Pro, MD Referring Physician Pediatrics  03/10/21   Garnet Sierras, DO Consulting Physician Allergy  03/10/21   Sheldon Silvan., MD Referring Physician Obstetrics and Gynecology  03/10/21     Chief Complaint  Patient presents with   Hemorrhoids    Pt c/o reoccurring hemorrhoids with itching and swelling x 3 weeks     Subjective: Pt presents for an OV with complaints of external hemorrhoids of 3 weeks duration.  Associated symptoms include itching and swelling. She has had hemorrhoids since the birth of her children.They occassionally flare and typically OTC products resolve her issues. She has tried those products, but she is still experiencing itching and swelling of her hemorrhoids. No rectal bleeding. No fever.     02/12/2022   10:31 AM 05/04/2021    2:42 PM 03/10/2021    1:12 PM  Depression screen PHQ 2/9  Decreased Interest 0 0 0  Down, Depressed, Hopeless 0 0 0  PHQ - 2 Score 0 0 0    No Known Allergies Social History   Social History Narrative   Marital status/children/pets: Married.  2 children   Education/employment: Bachelor's degree.  Works as a Tree surgeon.   Safety:      -smoke alarm in the home:Yes     - wears seatbelt: Yes     - Feels safe in their relationships: Yes      Past Medical History:  Diagnosis Date   Abnormal Papanicolaou smear of cervix    Asthma    on daily meds with rescue   Bronchitis    COVID-19 09/2020   GERD (gastroesophageal reflux disease)    on meds   H/O bilateral breast reduction surgery 08/23/2008   Formatting of this note might be different from the original. Overview:  1997 [place], [place] Formatting of this note might be different from the original. [place]   Recurrent upper respiratory infection (URI)    Seasonal allergies    Past Surgical History:  Procedure Laterality Date   BREAST REDUCTION SURGERY  1996   CESAREAN SECTION  2007   WISDOM TOOTH EXTRACTION     Family History  Problem Relation Age of Onset   Early death Father    Colon cancer Father 60       secondary to colitis   Colitis Father    Allergic rhinitis Neg Hx    Angioedema Neg Hx    Asthma Neg Hx    Atopy Neg Hx    Eczema Neg Hx    Immunodeficiency Neg Hx    Urticaria Neg Hx    Esophageal cancer Neg Hx    Stomach cancer Neg Hx    Rectal cancer Neg Hx    Allergies as of 02/12/2022   No Known Allergies      Medication List        Accurate as of February 12, 2022  3:12 PM. If you have any questions, ask your nurse or doctor.          STOP taking these medications    levofloxacin 750 MG tablet Commonly known as: Levaquin Stopped by: Howard Pouch, DO       TAKE these medications  albuterol 108 (90 Base) MCG/ACT inhaler Commonly known as: VENTOLIN HFA Inhale 2 puffs into the lungs every 4 (four) hours as needed for wheezing or shortness of breath (coughing fits).   albuterol (2.5 MG/3ML) 0.083% nebulizer solution Commonly known as: PROVENTIL Take 3 mLs (2.5 mg total) by nebulization every 4 (four) hours as needed for wheezing or shortness of breath (coughing fits).   Breztri Aerosphere 160-9-4.8 MCG/ACT Aero Generic drug: Budeson-Glycopyrrol-Formoterol Inhale 2 puffs into the lungs in the morning and at bedtime. with spacer and rinse mouth afterwards.   cetirizine 10 MG tablet Commonly known as: ZYRTEC Take 10 mg by mouth daily as needed for allergies.   hydrocortisone 2.5 % rectal cream Commonly known as: Anusol-HC Place 1 application  rectally 2 (two) times daily. Started by: Howard Pouch, DO   hydrocortisone 25 MG suppository Commonly known as: Anusol-HC Place 1 suppository (25 mg total) rectally 2 (two) times  daily. Started by: Howard Pouch, DO   levonorgestrel 20 MCG/24HR IUD Commonly known as: MIRENA by Intrauterine route.   montelukast 10 MG tablet Commonly known as: Singulair Take 1 tablet (10 mg total) by mouth at bedtime.   MULTIVITAMIN PO Take 2 tablets by mouth daily at 6 (six) AM.   omeprazole 40 MG capsule Commonly known as: PRILOSEC Take 1 capsule (40 mg total) by mouth daily.   Xhance 93 MCG/ACT Exhu Generic drug: Fluticasone Propionate Place 2 sprays into both nostrils 2 (two) times daily.        All past medical history, surgical history, allergies, family history, immunizations andmedications were updated in the EMR today and reviewed under the history and medication portions of their EMR.     ROS Negative, with the exception of above mentioned in HPI   Objective:  BP 100/65   Pulse 67   Temp 97.7 F (36.5 C) (Oral)   Ht '5\' 5"'$  (1.651 m)   Wt 152 lb (68.9 kg)   SpO2 97%   BMI 25.29 kg/m  Body mass index is 25.29 kg/m. Physical Exam Vitals and nursing note reviewed.  Constitutional:      General: She is not in acute distress.    Appearance: Normal appearance. She is normal weight. She is not ill-appearing or toxic-appearing.  Eyes:     Extraocular Movements: Extraocular movements intact.     Conjunctiva/sclera: Conjunctivae normal.     Pupils: Pupils are equal, round, and reactive to light.  Neurological:     Mental Status: She is alert and oriented to person, place, and time. Mental status is at baseline.  Psychiatric:        Mood and Affect: Mood normal.        Behavior: Behavior normal.        Thought Content: Thought content normal.        Judgment: Judgment normal.    No results found. No results found. No results found for this or any previous visit (from the past 24 hour(s)).  Assessment/Plan: Lindsay Mays is a 46 y.o. female present for OV for  Hemorrhoids, unspecified hemorrhoid type Recommend OTC sitz bath Anusol supp and  cream prescribed.  Encouraged soft but formed stools.  Hydrate. Miralax if needed.  F/u PRN  Reviewed expectations re: course of current medical issues. Discussed self-management of symptoms. Outlined signs and symptoms indicating need for more acute intervention. Patient verbalized understanding and all questions were answered. Patient received an After-Visit Summary.    No orders of the defined types were placed in this  encounter.  Meds ordered this encounter  Medications   hydrocortisone (ANUSOL-HC) 25 MG suppository    Sig: Place 1 suppository (25 mg total) rectally 2 (two) times daily.    Dispense:  12 suppository    Refill:  2   hydrocortisone (ANUSOL-HC) 2.5 % rectal cream    Sig: Place 1 application  rectally 2 (two) times daily.    Dispense:  30 g    Refill:  2   Referral Orders  No referral(s) requested today     Note is dictated utilizing voice recognition software. Although note has been proof read prior to signing, occasional typographical errors still can be missed. If any questions arise, please do not hesitate to call for verification.   electronically signed by:  Howard Pouch, DO  [place]

## 2022-02-24 NOTE — Progress Notes (Signed)
Follow Up Note  RE: MARSELLA SUMAN MRN: [id number] DOB: 1976/02/12 Date of Office Visit: 02/25/2022  Referring provider: Ma Hillock, DO Primary care provider: Ma Hillock, DO  Chief Complaint: Allergic Rhinitis , Asthma, Gastroesophageal Reflux (No issues ), and Cough (Some cough better than in the past. )  History of Present Illness: I had the pleasure of seeing Fradel Baldonado for a follow up visit at the [facility] [place] [facility] on 02/25/2022. She is a 46 y.o. female, who is being followed for post-COVID chronic cough, asthma, recurrent infections, GERD, allergic rhinitis and history of nasal polypectomy. Her previous allergy office visit was on 11/19/2021 with Dr. Maudie Mercury. Today is a regular follow up visit.  Post-COVID chronic cough Coughing is much better about 85% improved and having little mucous now.  Finished antibiotics.  No additional prednisone since the last visit.    Asthma Currently on Breztri 2 puffs twice a day and only had to use albuterol once. Denies any ER/urgent care visits or prednisone use since the last visit.   Recurrent infections Did not get pneumonia shot.    Gastroesophageal reflux disease Ran out of omeprazole for a few days and had globus sensation.   Seasonal allergic rhinitis due to pollen [employer] a few weeks ago with no issues.  Still taking Singulair daily.  Assessment and Plan: Daleyza is a 46 y.o. female with: Post-COVID chronic cough Past history - Diagnosed with COVID-19 in January 2021 and developed a persistent cough afterwards.  Evaluated by pulmonology and diagnosed with asthma. Up-to-date with COVID-19 vaccination. Covid-19 in July 2022 again. CT chest showed nodular densities.  5 courses of prednisone in 2022. Bloodwork negative to alpha-1 antitrypsin, ANA, and ANCA. Interim history - CXR showed some mild fluid/infection in the lower lungs, finished Levaquin. Coughing 85% improved.4 Monitor symptoms.  Moderate  persistent asthma without complication Past history - Diagnosed with COVID-19 in January 2021 and developed a persistent cough afterwards.  Evaluated by pulmonology and diagnosed with asthma. Up-to-date with COVID-19 vaccination. Covid-19 in July 2022. 5 courses of prednisone in 2022. CT chest showed nodular densities. Eos 100, Ige 116. Dislikes Trelegy. Interim history - coughing 85% improved. Did not start biologics as symptoms improved. Today's spirometry was normal. Daily controller medication(s):  Breztri 2 puffs twice a day with spacer and rinse mouth afterwards.  During upper respiratory infections/asthma flares:  Use albuterol 2 puffs or albuterol nebulizer twice a day for 1-2 weeks until symptoms subside.  If you need to use your albuterol nebulizer machine back to back within 15-30 minutes with no relief then please go to the ER/urgent care for further evaluation.  May use albuterol rescue inhaler 2 puffs or nebulizer every 4 to 6 hours as needed for shortness of breath, chest tightness, coughing, and wheezing. May use albuterol rescue inhaler 2 puffs 5 to 15 minutes prior to strenuous physical activities. Monitor frequency of use.  Get spirometry at next visit. If doing better at next visit will discuss stepping down therapy.  Recurrent infections Past history - Normal immunoglobulin levels, low pneumococcal titers.  Interim history - no antibiotics, forgot to get pneumovax. Keep track of infections. Get pneumonia vaccine.  Get bloodwork 4 weeks after vaccine.  Get flu shot in the fall.   Gastroesophageal reflux disease Past history - Globus sensation resolved with omeprazole. Interim history - symptomatic when missed a few doses. Continue lifestyle and dietary modifications. Omeprazole '40mg'$  daily in the morning. Nothing to eat or  drink for 30 minutes.  Seasonal allergic rhinitis due to pollen Past history - Rhinoconjunctivitis symptoms for many years mainly in the spring and  fall but now worsening since May.  2021 bloodwork was negative. 2021 skin testing showed: Positive to grass pollen only. S/p polypectomy and deviated septum repair in 2022. 2023 bloodwork negative to environmental allergy panel.  Interim history - stopped Xhance with no flaring symptoms. Continue environmental control measures as below. May use over the counter antihistamines such as Zyrtec (cetirizine), Claritin (loratadine), Allegra (fexofenadine), or Xyzal (levocetirizine) daily as needed. May take twice a day if needed. Continue Singulair (montelukast) '10mg'$  daily at night. Stop after 1-2 months.  Nasal saline spray (i.e., Simply Saline) or nasal saline lavage (i.e., NeilMed) is recommended as needed and prior to medicated nasal sprays.  History of nasal polypectomy Past history - polypectomy in January 2022. Only use if needed: Xhance (fluticasone) nasal spray 1-2 sprays per nostril twice a day as needed for nasal congestion.   Return in about 4 months (around 06/27/2022).  Meds ordered this encounter  Medications   pneumococcal 23 valent vaccine (PNEUMOVAX 23) 25 MCG/0.5ML injection    Sig: Inject 0.5 mLs into the muscle once for 1 dose.    Dispense:  0.5 mL    Refill:  0   Lab Orders  No laboratory test(s) ordered today    Diagnostics: Spirometry:  Tracings reviewed. Her effort: Good reproducible efforts. FVC: 3.33L FEV1: 2.69L, 91% predicted FEV1/FVC ratio: 81% Interpretation: Spirometry consistent with normal pattern.  Please see scanned spirometry results for details.  Medication List:  Current Outpatient Medications  Medication Sig Dispense Refill   albuterol (PROVENTIL) (2.5 MG/3ML) 0.083% nebulizer solution Take 3 mLs (2.5 mg total) by nebulization every 4 (four) hours as needed for wheezing or shortness of breath (coughing fits). 75 mL 2   albuterol (VENTOLIN HFA) 108 (90 Base) MCG/ACT inhaler Inhale 2 puffs into the lungs every 4 (four) hours as needed for wheezing  or shortness of breath (coughing fits). 54 g 0   Budeson-Glycopyrrol-Formoterol (BREZTRI AEROSPHERE) 160-9-4.8 MCG/ACT AERO Inhale 2 puffs into the lungs in the morning and at bedtime. with spacer and rinse mouth afterwards. 32.1 g 2   cetirizine (ZYRTEC) 10 MG tablet Take 10 mg by mouth daily as needed for allergies.     Fluticasone Propionate (XHANCE) 93 MCG/ACT EXHU Place 2 sprays into both nostrils 2 (two) times daily. 32 mL 5   hydrocortisone (ANUSOL-HC) 2.5 % rectal cream Place 1 application  rectally 2 (two) times daily. 30 g 2   hydrocortisone (ANUSOL-HC) 25 MG suppository Place 1 suppository (25 mg total) rectally 2 (two) times daily. 12 suppository 2   levonorgestrel (MIRENA) 20 MCG/24HR IUD by Intrauterine route.     montelukast (SINGULAIR) 10 MG tablet Take 1 tablet (10 mg total) by mouth at bedtime. 90 tablet 2   Multiple Vitamin (MULTIVITAMIN PO) Take 2 tablets by mouth daily at 6 (six) AM.     omeprazole (PRILOSEC) 40 MG capsule Take 1 capsule (40 mg total) by mouth daily. 90 capsule 2   pneumococcal 23 valent vaccine (PNEUMOVAX 23) 25 MCG/0.5ML injection Inject 0.5 mLs into the muscle once for 1 dose. 0.5 mL 0   No current facility-administered medications for this visit.   Allergies: No Known Allergies I reviewed her past medical history, social history, family history, and environmental history and no significant changes have been reported from her previous visit.  Review of Systems  Constitutional:  Negative for  appetite change, chills, fever and unexpected weight change.  HENT:  Negative for congestion, rhinorrhea and sneezing.   Eyes:  Negative for itching.  Respiratory:  Positive for cough (much improved). Negative for chest tightness, shortness of breath and wheezing.   Cardiovascular:  Negative for chest pain.  Gastrointestinal:  Negative for abdominal pain.  Genitourinary:  Negative for difficulty urinating.  Skin:  Negative for rash.  Allergic/Immunologic:  Positive for environmental allergies.  Neurological:  Negative for headaches.    Objective: BP 108/76   Pulse 65   Temp 98.4 F (36.9 C)   Resp 16   Ht 5' 5.35" (1.66 m)   Wt 144 lb 12.8 oz (65.7 kg)   SpO2 98%   BMI 23.84 kg/m  Body mass index is 23.84 kg/m. Physical Exam Vitals and nursing note reviewed.  Constitutional:      Appearance: Normal appearance. She is well-developed.  HENT:     Head: Normocephalic and atraumatic.     Right Ear: Tympanic membrane and external ear normal.     Left Ear: Tympanic membrane and external ear normal.     Nose: Nose normal.     Mouth/Throat:     Mouth: Mucous membranes are moist.  Eyes:     Conjunctiva/sclera: Conjunctivae normal.  Cardiovascular:     Rate and Rhythm: Normal rate and regular rhythm.     Heart sounds: Normal heart sounds. No murmur heard. Pulmonary:     Effort: Pulmonary effort is normal.     Breath sounds: No wheezing, rhonchi or rales.  Musculoskeletal:     Cervical back: Neck supple.  Skin:    General: Skin is warm.     Findings: No rash.  Neurological:     Mental Status: She is alert and oriented to person, place, and time.  Psychiatric:        Behavior: Behavior normal.    Previous notes and tests were reviewed. The plan was reviewed with the patient/family, and all questions/concerned were addressed.  It was my pleasure to see Tyniah today and participate in her care. Please feel free to contact me with any questions or concerns.  Sincerely,  Rexene Alberts, DO Allergy & Immunology  [facility] office: [place] office: [phone]

## 2022-02-25 ENCOUNTER — Other Ambulatory Visit: Payer: Self-pay

## 2022-02-25 ENCOUNTER — Encounter: Payer: Self-pay | Admitting: Allergy

## 2022-02-25 ENCOUNTER — Ambulatory Visit (INDEPENDENT_AMBULATORY_CARE_PROVIDER_SITE_OTHER): Payer: BC Managed Care – PPO | Admitting: Allergy

## 2022-02-25 VITALS — BP 108/76 | HR 65 | Temp 98.4°F | Resp 16 | Ht 65.35 in | Wt 144.8 lb

## 2022-02-25 DIAGNOSIS — J301 Allergic rhinitis due to pollen: Secondary | ICD-10-CM

## 2022-02-25 DIAGNOSIS — B999 Unspecified infectious disease: Secondary | ICD-10-CM

## 2022-02-25 DIAGNOSIS — R053 Chronic cough: Secondary | ICD-10-CM

## 2022-02-25 DIAGNOSIS — Z8709 Personal history of other diseases of the respiratory system: Secondary | ICD-10-CM

## 2022-02-25 DIAGNOSIS — J4540 Moderate persistent asthma, uncomplicated: Secondary | ICD-10-CM | POA: Diagnosis not present

## 2022-02-25 DIAGNOSIS — U099 Post covid-19 condition, unspecified: Secondary | ICD-10-CM

## 2022-02-25 DIAGNOSIS — K219 Gastro-esophageal reflux disease without esophagitis: Secondary | ICD-10-CM

## 2022-02-25 MED ORDER — PNEUMOVAX 23 25 MCG/0.5ML IJ INJ: 0.5000 mL | INJECTION | Freq: Once | INTRAMUSCULAR | 0 refills | Status: AC

## 2022-02-25 NOTE — Patient Instructions (Addendum)
Your breathing test was normal today.  Asthma: Daily controller medication(s):  Breztri 2 puffs twice a day with spacer and rinse mouth afterwards.  During upper respiratory infections/asthma flares:  Use albuterol 2 puffs or albuterol nebulizer twice a day for 1-2 weeks until symptoms subside.  If you need to use your albuterol nebulizer machine back to back within 15-30 minutes with no relief then please go to the ER/urgent care for further evaluation.  May use albuterol rescue inhaler 2 puffs or nebulizer every 4 to 6 hours as needed for shortness of breath, chest tightness, coughing, and wheezing. May use albuterol rescue inhaler 2 puffs 5 to 15 minutes prior to strenuous physical activities. Monitor frequency of use.  Asthma control goals:  Full participation in all desired activities (may need albuterol before activity) Albuterol use two times or less a week on average (not counting use with activity) Cough interfering with sleep two times or less a month Oral steroids no more than once a year No hospitalizations   Environmental allergies 2021 skin testing positive to grass pollen.  Continue environmental control measures as below. May use over the counter antihistamines such as Zyrtec (cetirizine), Claritin (loratadine), Allegra (fexofenadine), or Xyzal (levocetirizine) daily as needed. May take twice a day if needed. Continue Singulair (montelukast) '10mg'$  daily at night. Stop after 1-2 months.  Nasal saline spray (i.e., Simply Saline) or nasal saline lavage (i.e., NeilMed) is recommended as needed and prior to medicated nasal sprays.  History of polyps Only use if needed: Xhance (fluticasone) nasal spray 1-2 sprays per nostril twice a day as needed for nasal congestion.   Heartburn: Continue lifestyle and dietary modifications. Omeprazole '40mg'$  daily in the morning. Nothing to eat or drink for 30 minutes.  Infections Keep track of infections. Get pneumonia vaccine.  Get  bloodwork 4 weeks after vaccine.  Get flu shot in the fall.   Follow up in 4 months or sooner if needed.   Reducing Pollen Exposure Pollen seasons: trees (spring), grass (summer) and ragweed/weeds (fall). Keep windows closed in your home and car to lower pollen exposure.  Install air conditioning in the bedroom and throughout the house if possible.  Avoid going out in dry windy days - especially early morning. Pollen counts are highest between 5 - 10 AM and on dry, hot and windy days.  Save outside activities for late afternoon or after a heavy rain, when pollen levels are lower.  Avoid mowing of grass if you have grass pollen allergy. Be aware that pollen can also be transported indoors on people and pets.  Dry your clothes in an automatic dryer rather than hanging them outside where they might collect pollen.  Rinse hair and eyes before bedtime.

## 2022-02-25 NOTE — Assessment & Plan Note (Signed)
Past history - Globus sensation resolved with omeprazole. Interim history - symptomatic when missed a few doses.  Continue lifestyle and dietary modifications.  Omeprazole '40mg'$  daily in the morning. Nothing to eat or drink for 30 minutes.

## 2022-02-25 NOTE — Assessment & Plan Note (Signed)
Past history - Normal immunoglobulin levels, low pneumococcal titers.  Interim history - no antibiotics, forgot to get pneumovax. Marland Kitchen Keep track of infections. . Get pneumonia vaccine.  o Get bloodwork 4 weeks after vaccine.  . Get flu shot in the fall.

## 2022-02-25 NOTE — Assessment & Plan Note (Signed)
Past history - polypectomy in January 2022.  Only use if needed: Xhance (fluticasone) nasal spray 1-2 sprays per nostril twice a day as needed for nasal congestion.  

## 2022-02-25 NOTE — Assessment & Plan Note (Addendum)
Past history - Diagnosed with COVID-19 in January 2021 and developed a persistent cough afterwards.  Evaluated by pulmonology and diagnosed with asthma. Up-to-date with COVID-19 vaccination. Covid-19 in July 2022. 5 courses of prednisone in 2022. CT chest showed nodular densities. Eos 100, Ige 116. Dislikes Trelegy. Interim history - coughing 85% improved. Did not start biologics as symptoms improved. . Today's spirometry was normal. . Daily controller medication(s):  o Breztri 2 puffs twice a day with spacer and rinse mouth afterwards.  . During upper respiratory infections/asthma flares:  o Use albuterol 2 puffs or albuterol nebulizer twice a day for 1-2 weeks until symptoms subside.  o If you need to use your albuterol nebulizer machine back to back within 15-30 minutes with no relief then please go to the ER/urgent care for further evaluation.  . May use albuterol rescue inhaler 2 puffs or nebulizer every 4 to 6 hours as needed for shortness of breath, chest tightness, coughing, and wheezing. May use albuterol rescue inhaler 2 puffs 5 to 15 minutes prior to strenuous physical activities. Monitor frequency of use.  . Get spirometry at next visit. . If doing better at next visit will discuss stepping down therapy.

## 2022-02-25 NOTE — Assessment & Plan Note (Signed)
Past history - Diagnosed with COVID-19 in January 2021 and developed a persistent cough afterwards.  Evaluated by pulmonology and diagnosed with asthma. Up-to-date with COVID-19 vaccination. Covid-19 in July 2022 again. CT chest showed nodular densities.  5 courses of prednisone in 2022. Bloodwork negative to alpha-1 antitrypsin, ANA, and ANCA. Interim history - CXR showed some mild fluid/infection in the lower lungs, finished Levaquin. Coughing 85% improved.4  Monitor symptoms.

## 2022-02-25 NOTE — Assessment & Plan Note (Signed)
Past history - Rhinoconjunctivitis symptoms for many years mainly in the spring and fall but now worsening since May.  2021 bloodwork was negative. 2021 skin testing showed: Positive to grass pollen only. S/p polypectomy and deviated septum repair in 2022. 2023 bloodwork negative to environmental allergy panel.  Interim history - stopped Xhance with no flaring symptoms.  Continue environmental control measures as below.  May use over the counter antihistamines such as Zyrtec (cetirizine), Claritin (loratadine), Allegra (fexofenadine), or Xyzal (levocetirizine) daily as needed. May take twice a day if needed. . Continue Singulair (montelukast) '10mg'$  daily at night. Stop after 1-2 months.   Nasal saline spray (i.e., Simply Saline) or nasal saline lavage (i.e., NeilMed) is recommended as needed and prior to medicated nasal sprays.

## 2022-02-26 ENCOUNTER — Encounter: Payer: Self-pay | Admitting: Family Medicine

## 2022-02-26 ENCOUNTER — Ambulatory Visit (INDEPENDENT_AMBULATORY_CARE_PROVIDER_SITE_OTHER): Payer: BC Managed Care – PPO | Admitting: Family Medicine

## 2022-02-26 VITALS — BP 106/68 | HR 70 | Temp 97.8°F | Wt 144.0 lb

## 2022-02-26 DIAGNOSIS — K649 Unspecified hemorrhoids: Secondary | ICD-10-CM | POA: Diagnosis not present

## 2022-02-26 MED ORDER — HYDROCORTISONE ACETATE 25 MG RE SUPP: 25.0000 mg | Freq: Two times a day (BID) | RECTAL | 5 refills | Status: DC

## 2022-03-03 DIAGNOSIS — H40003 Preglaucoma, unspecified, bilateral: Secondary | ICD-10-CM | POA: Diagnosis not present

## 2022-05-05 ENCOUNTER — Ambulatory Visit (INDEPENDENT_AMBULATORY_CARE_PROVIDER_SITE_OTHER): Payer: BC Managed Care – PPO | Admitting: Family Medicine

## 2022-05-05 ENCOUNTER — Encounter: Payer: Self-pay | Admitting: Family Medicine

## 2022-05-05 VITALS — BP 78/48 | HR 64 | Temp 97.8°F | Ht 65.5 in | Wt 148.0 lb

## 2022-05-05 DIAGNOSIS — Z23 Encounter for immunization: Secondary | ICD-10-CM

## 2022-05-05 DIAGNOSIS — Z79899 Other long term (current) drug therapy: Secondary | ICD-10-CM

## 2022-05-05 DIAGNOSIS — Z1231 Encounter for screening mammogram for malignant neoplasm of breast: Secondary | ICD-10-CM | POA: Diagnosis not present

## 2022-05-05 DIAGNOSIS — Z0000 Encounter for general adult medical examination without abnormal findings: Secondary | ICD-10-CM | POA: Diagnosis not present

## 2022-05-05 LAB — COMPREHENSIVE METABOLIC PANEL
ALT: 12 U/L (ref 0–35)
AST: 14 U/L (ref 0–37)
Albumin: 3.9 g/dL (ref 3.5–5.2)
Alkaline Phosphatase: 39 U/L (ref 39–117)
BUN: 13 mg/dL (ref 6–23)
CO2: 24 mEq/L (ref 19–32)
Calcium: 8.8 mg/dL (ref 8.4–10.5)
Chloride: 108 mEq/L (ref 96–112)
Creatinine, Ser: 0.73 mg/dL (ref 0.40–1.20)
GFR: 97.37 mL/min (ref >60.00)
Glucose, Bld: 74 mg/dL (ref 70–99)
Potassium: 4.2 mEq/L (ref 3.5–5.1)
Sodium: 139 mEq/L (ref 135–145)
Total Bilirubin: 0.5 mg/dL (ref 0.2–1.2)
Total Protein: 6.5 g/dL (ref 6.0–8.3)

## 2022-05-05 LAB — CBC
HCT: 41.7 % (ref 36.0–46.0)
Hemoglobin: 14.1 g/dL (ref 12.0–15.0)
MCHC: 33.7 g/dL (ref 30.0–36.0)
MCV: 91.1 fl (ref 78.0–100.0)
Platelets: 276 10*3/uL (ref 150.0–400.0)
RBC: 4.58 Mil/uL (ref 3.87–5.11)
RDW: 12.4 % (ref 11.5–15.5)
WBC: 9.3 10*3/uL (ref 4.0–10.5)

## 2022-05-05 LAB — LIPID PANEL
Cholesterol: 177 mg/dL (ref 0–200)
HDL: 45.5 mg/dL (ref >39.00)
LDL Cholesterol: 119 mg/dL — ABNORMAL HIGH (ref 0–99)
NonHDL: 131.92
Total CHOL/HDL Ratio: 4
Triglycerides: 65 mg/dL (ref 0.0–149.0)
VLDL: 13 mg/dL (ref 0.0–40.0)

## 2022-05-05 LAB — TSH: TSH: 1.4 u[IU]/mL (ref 0.35–5.50)

## 2022-05-05 LAB — HEMOGLOBIN A1C: Hgb A1c MFr Bld: 5.1 % (ref 4.6–6.5)

## 2022-05-05 NOTE — Patient Instructions (Signed)
No follow-ups on file.        Great to see you today.  I have refilled the medication(s) we provide.   If labs were collected, we will inform you of lab results once received either by echart message or telephone call.   - echart message- for normal results that have been seen by the patient already.   - telephone call: abnormal results or if patient has not viewed results in their echart.  Health Maintenance, Female Adopting a healthy lifestyle and getting preventive care are important in promoting health and wellness. Ask your health care provider about: The right schedule for you to have regular tests and exams. Things you can do on your own to prevent diseases and keep yourself healthy. What should I know about diet, weight, and exercise? Eat a healthy diet  Eat a diet that includes plenty of vegetables, fruits, low-fat dairy products, and lean protein. Do not eat a lot of foods that are high in solid fats, added sugars, or sodium. Maintain a healthy weight Body mass index (BMI) is used to identify weight problems. It estimates body fat based on height and weight. Your health care provider can help determine your BMI and help you achieve or maintain a healthy weight. Get regular exercise Get regular exercise. This is one of the most important things you can do for your health. Most adults should: Exercise for at least 150 minutes each week. The exercise should increase your heart rate and make you sweat (moderate-intensity exercise). Do strengthening exercises at least twice a week. This is in addition to the moderate-intensity exercise. Spend less time sitting. Even light physical activity can be beneficial. Watch cholesterol and blood lipids Have your blood tested for lipids and cholesterol at 48 years of age, then have this test every 5 years. Have your cholesterol levels checked more often if: Your lipid or cholesterol levels are high. You are older than 48 years of  age. You are at high risk for heart disease. What should I know about cancer screening? Depending on your health history and family history, you may need to have cancer screening at various ages. This may include screening for: Breast cancer. Cervical cancer. Colorectal cancer. Skin cancer. Lung cancer. What should I know about heart disease, diabetes, and high blood pressure? Blood pressure and heart disease High blood pressure causes heart disease and increases the risk of stroke. This is more likely to develop in people who have high blood pressure readings or are overweight. Have your blood pressure checked: Every 3-5 years if you are 18-39 years of age. Every year if you are 40 years old or older. Diabetes Have regular diabetes screenings. This checks your fasting blood sugar level. Have the screening done: Once every three years after age 40 if you are at a normal weight and have a low risk for diabetes. More often and at a younger age if you are overweight or have a high risk for diabetes. What should I know about preventing infection? Hepatitis B If you have a higher risk for hepatitis B, you should be screened for this virus. Talk with your health care provider to find out if you are at risk for hepatitis B infection. Hepatitis C Testing is recommended for: Everyone born from 1945 through 1965. Anyone with known risk factors for hepatitis C. Sexually transmitted infections (STIs) Get screened for STIs, including gonorrhea and chlamydia, if: You are sexually active and are younger than 48 years of age. You are   older than 48 years of age and your health care provider tells you that you are at risk for this type of infection. Your sexual activity has changed since you were last screened, and you are at increased risk for chlamydia or gonorrhea. Ask your health care provider if you are at risk. Ask your health care provider about whether you are at high risk for HIV. Your health  care provider may recommend a prescription medicine to help prevent HIV infection. If you choose to take medicine to prevent HIV, you should first get tested for HIV. You should then be tested every 3 months for as long as you are taking the medicine. Pregnancy If you are about to stop having your period (premenopausal) and you may become pregnant, seek counseling before you get pregnant. Take 400 to 800 micrograms (mcg) of folic acid every day if you become pregnant. Ask for birth control (contraception) if you want to prevent pregnancy. Osteoporosis and menopause Osteoporosis is a disease in which the bones lose minerals and strength with aging. This can result in bone fractures. If you are 65 years old or older, or if you are at risk for osteoporosis and fractures, ask your health care provider if you should: Be screened for bone loss. Take a calcium or vitamin D supplement to lower your risk of fractures. Be given hormone replacement therapy (HRT) to treat symptoms of menopause. Follow these instructions at home: Alcohol use Do not drink alcohol if: Your health care provider tells you not to drink. You are pregnant, may be pregnant, or are planning to become pregnant. If you drink alcohol: Limit how much you have to: 0-1 drink a day. Know how much alcohol is in your drink. In the [place] one drink equals one 12 oz bottle of beer (355 mL), one 5 oz glass of wine (148 mL), or one 1 oz glass of hard liquor (44 mL). Lifestyle Do not use any products that contain nicotine or tobacco. These products include cigarettes, chewing tobacco, and vaping devices, such as e-cigarettes. If you need help quitting, ask your health care provider. Do not use street drugs. Do not share needles. Ask your health care provider for help if you need support or information about quitting drugs. General instructions Schedule regular health, dental, and eye exams. Stay current with your vaccines. Tell your health  care provider if: You often feel depressed. You have ever been abused or do not feel safe at home. Summary Adopting a healthy lifestyle and getting preventive care are important in promoting health and wellness. Follow your health care provider's instructions about healthy diet, exercising, and getting tested or screened for diseases. Follow your health care provider's instructions on monitoring your cholesterol and blood pressure. This information is not intended to replace advice given to you by your health care provider. Make sure you discuss any questions you have with your health care provider. Document Revised: 01/12/2021 Document Reviewed: 01/12/2021 Elsevier Patient Education  2023 Elsevier Inc.  

## 2022-05-05 NOTE — Progress Notes (Signed)
Patient ID: Lindsay Mays, female  DOB: 07-05-1976, 46 y.o.   MRN: [id number] Patient Care Team    Relationship Specialty Notifications Start End  Ma Hillock, DO PCP - General Family Medicine  03/10/21   Charlaine Dalton, MD Referring Physician   03/10/21   Malachi Pro, MD Referring Physician Pediatrics  03/10/21   Garnet Sierras, DO Consulting Physician Allergy  03/10/21   Sheldon Silvan., MD Referring Physician Obstetrics and Gynecology  03/10/21     Chief Complaint  Patient presents with   Annual Exam    Pt is fasting    Subjective:  Lindsay Mays is a 46 y.o.  Female  present for CPE All past medical history, surgical history, allergies, family history, immunizations, medications and social history were updated in the electronic medical record today. All recent labs, ED visits and hospitalizations within the last year were reviewed.  Health maintenance:  Colonoscopy: FHX colon cancer in father (6)- 07/2021 - 3 yr follow up- Dr. Bryan Lemma Mammogram: 12/16/2021- normal. [facility] Cervical cancer screening: last pap: 2021- GYN Immunizations: tdap UTD 04/2021, Influenza 2023 today-(encouraged yearly), covid series completed.  Infectious disease screening: HIV completed w/ preg., Hep C completed  DEXA: per routine screen Patient has a Dental home. Hospitalizations/ED visits: reviewed      05/05/2022    8:05 AM 02/12/2022   10:31 AM 05/04/2021    2:42 PM 03/10/2021    1:12 PM  Depression screen PHQ 2/9  Decreased Interest 0 0 0 0  Down, Depressed, Hopeless 0 0 0 0  PHQ - 2 Score 0 0 0 0       No data to display          Immunization History  Administered Date(s) Administered   Influenza Split 09/07/2011, 12/03/2012   Influenza,inj,Quad PF,6+ Mos 05/05/2022   Influenza-Unspecified 06/06/2020   Moderna Sars-Covid-2 Vaccination 11/24/2019, 12/25/2019, 08/11/2020   Tdap 05/04/2021    Past Medical History:  Diagnosis Date   Abnormal  Papanicolaou smear of cervix    Asthma    on daily meds with rescue   Bronchitis    COVID-19 09/2020   GERD (gastroesophageal reflux disease)    on meds   H/O bilateral breast reduction surgery 08/23/2008   Formatting of this note might be different from the original. Overview:  1999 [place], [place] Formatting of this note might be different from the original. [place], [place]   Recurrent upper respiratory infection (URI)    Seasonal allergies    No Known Allergies Past Surgical History:  Procedure Laterality Date   BREAST REDUCTION SURGERY  1996   CESAREAN SECTION  2007   WISDOM TOOTH EXTRACTION     Family History  Problem Relation Age of Onset   Early death Father    Colon cancer Father 17       secondary to colitis   Colitis Father    Allergic rhinitis Neg Hx    Angioedema Neg Hx    Asthma Neg Hx    Atopy Neg Hx    Eczema Neg Hx    Immunodeficiency Neg Hx    Urticaria Neg Hx    Esophageal cancer Neg Hx    Stomach cancer Neg Hx    Rectal cancer Neg Hx    Social History   Social History Narrative   Marital status/children/pets: Married.  2 children   Education/employment: Bachelor's degree.  Works as a Tree surgeon.   Safety:      -  smoke alarm in the home:Yes     - wears seatbelt: Yes     - Feels safe in their relationships: Yes       Allergies as of 05/05/2022   No Known Allergies      Medication List        Accurate as of May 05, 2022  8:25 AM. If you have any questions, ask your nurse or doctor.          STOP taking these medications    hydrocortisone 2.5 % rectal cream Commonly known as: Anusol-HC Stopped by: Howard Pouch, DO   hydrocortisone 25 MG suppository Commonly known as: Anusol-HC Stopped by: Howard Pouch, DO   Xhance 93 MCG/ACT Exhu Generic drug: Fluticasone Propionate Stopped by: Howard Pouch, DO       TAKE these medications    albuterol 108 (90 Base) MCG/ACT inhaler Commonly known as: VENTOLIN HFA Inhale 2 puffs into  the lungs every 4 (four) hours as needed for wheezing or shortness of breath (coughing fits).   albuterol (2.5 MG/3ML) 0.083% nebulizer solution Commonly known as: PROVENTIL Take 3 mLs (2.5 mg total) by nebulization every 4 (four) hours as needed for wheezing or shortness of breath (coughing fits).   Breztri Aerosphere 160-9-4.8 MCG/ACT Aero Generic drug: Budeson-Glycopyrrol-Formoterol Inhale 2 puffs into the lungs in the morning and at bedtime. with spacer and rinse mouth afterwards.   cetirizine 10 MG tablet Commonly known as: ZYRTEC Take 10 mg by mouth daily as needed for allergies.   levonorgestrel 20 MCG/24HR IUD Commonly known as: MIRENA by Intrauterine route.   montelukast 10 MG tablet Commonly known as: Singulair Take 1 tablet (10 mg total) by mouth at bedtime.   MULTIVITAMIN PO Take 2 tablets by mouth daily at 6 (six) AM.   omeprazole 40 MG capsule Commonly known as: PRILOSEC Take 1 capsule (40 mg total) by mouth daily.        All past medical history, surgical history, allergies, family history, immunizations andmedications were updated in the EMR today and reviewed under the history and medication portions of their EMR.     No results found for this or any previous visit (from the past 2160 hour(s)).  DG Chest 2 View Result Date: 03/13/2021 CLINICAL DATA:  Left lower lobe crackles. Prior pneumonia. History of asthma. EXAM: CHEST - 2 VIEW COMPARISON:  10/12/2019 FINDINGS: Midline trachea. Normal heart size and mediastinal contours. No pleural effusion IMPRESSION: No acute cardiopulmonary disease. Interstitial thickening, likely related to the clinical history of asthma. Electronically Signed   By: Abigail Miyamoto M.D.   On: 03/13/2021 14:27     ROS 14 pt review of systems performed and negative (unless mentioned in an HPI)  Objective: BP (!) 78/48   Pulse 64   Temp 97.8 F (36.6 C) (Oral)   Ht 5' 5.5" (1.664 m)   Wt 148 lb (67.1 kg)   SpO2 99%   BMI 24.25  kg/m  Physical Exam Vitals and nursing note reviewed.  Constitutional:      General: She is not in acute distress.    Appearance: Normal appearance. She is not ill-appearing or toxic-appearing.  HENT:     Head: Normocephalic and atraumatic.     Right Ear: Tympanic membrane, ear canal and external ear normal. There is no impacted cerumen.     Left Ear: Tympanic membrane, ear canal and external ear normal. There is no impacted cerumen.     Nose: No congestion or rhinorrhea.  Mouth/Throat:     Mouth: Mucous membranes are moist.     Pharynx: Oropharynx is clear. No oropharyngeal exudate or posterior oropharyngeal erythema.  Eyes:     General: No scleral icterus.       Right eye: No discharge.        Left eye: No discharge.     Extraocular Movements: Extraocular movements intact.     Conjunctiva/sclera: Conjunctivae normal.     Pupils: Pupils are equal, round, and reactive to light.  Cardiovascular:     Rate and Rhythm: Normal rate and regular rhythm.     Pulses: Normal pulses.     Heart sounds: Normal heart sounds. No murmur heard.    No friction rub. No gallop.  Pulmonary:     Effort: Pulmonary effort is normal. No respiratory distress.     Breath sounds: Normal breath sounds. No stridor. No wheezing, rhonchi or rales.  Chest:     Chest wall: No tenderness.  Abdominal:     General: Abdomen is flat. Bowel sounds are normal. There is no distension.     Palpations: Abdomen is soft. There is no mass.     Tenderness: There is no abdominal tenderness. There is no right CVA tenderness, left CVA tenderness, guarding or rebound.     Hernia: No hernia is present.  Musculoskeletal:        General: No swelling, tenderness or deformity. Normal range of motion.     Cervical back: Normal range of motion and neck supple. No rigidity or tenderness.     Right lower leg: No edema.     Left lower leg: No edema.  Lymphadenopathy:     Cervical: No cervical adenopathy.  Skin:    General: Skin  is warm and dry.     Coloration: Skin is not jaundiced or pale.     Findings: No bruising, erythema, lesion or rash.  Neurological:     General: No focal deficit present.     Mental Status: She is alert and oriented to person, place, and time. Mental status is at baseline.     Cranial Nerves: No cranial nerve deficit.     Sensory: No sensory deficit.     Motor: No weakness.     Coordination: Coordination normal.     Gait: Gait normal.     Deep Tendon Reflexes: Reflexes normal.  Psychiatric:        Mood and Affect: Mood normal.        Behavior: Behavior normal.        Thought Content: Thought content normal.        Judgment: Judgment normal.     No results found.  Assessment/plan: TOMICKA LOVER is a 46 y.o. female present for CPE  Encounter for long-term current use of medication - CBC - Comprehensive metabolic panel - Hemoglobin A1c - Lipid panel - TSH  Routine general medical examination at a health care facility Colonoscopy: FHX colon cancer in father (61)- 07/2021 - 3 yr follow up- Dr. Bryan Lemma Mammogram: 12/16/2021- normal. [facility] Cervical cancer screening: last pap: 2021- GYN Immunizations: tdap UTD 04/2021, Influenza 2023 today-(encouraged yearly), covid series completed.  Infectious disease screening: HIV completed w/ preg., Hep C completed  DEXA: per routine screen Patient was encouraged to exercise greater than 150 minutes a week. Patient was encouraged to choose a diet filled with fresh fruits and vegetables, and lean meats. AVS provided to patient today for education/recommendation on gender specific health and safety maintenance.  Return in  about 1 year (around 05/07/2023).  Orders Placed This Encounter  Procedures   [facility] MAMMOGRAPHY   Flu Vaccine QUAD 6+ mos PF IM (Fluarix Quad PF)   CBC   Comprehensive metabolic panel   Hemoglobin A1c   Lipid panel   TSH    No orders of the defined types were placed in this encounter.  Referral Orders  No  referral(s) requested today    Electronically signed by: Howard Pouch, [place]

## 2022-05-07 DIAGNOSIS — M79671 Pain in right foot: Secondary | ICD-10-CM | POA: Diagnosis not present

## 2022-06-28 NOTE — Progress Notes (Signed)
Follow Up Note  RE: Lindsay Mays MRN: [id number] DOB: 12-01-1975 Date of Office Visit: 06/29/2022  Referring provider: Ma Hillock, DO Primary care provider: Ma Hillock, DO  Chief Complaint: Cough (Is still having the cough.Does not bother her with cough. Would like for it to go away ), Allergic Rhinitis , Gastroesophageal Reflux (Feels like it helps.She doesn't feel like she has anything stuck in her throat. ), and Medication Refill (Albuterol )  History of Present Illness: I had the pleasure of seeing Lindsay Mays for a follow up visit at the [facility] on 06/29/2022. She is a 46 y.o. female, who is being followed for cough, asthma, recurrent infections, GERD, allergic rhinitis and history of nasal polypectomy. Her previous allergy office visit was on 02/25/2022 with Dr. Maudie Mercury. Today is a regular follow up visit.  Post-COVID chronic cough Still coughing daily with clear phlegm. No specific time of the day where she noticed that's worse. It is overall improved since onset however no additional improvement since last visit.  Moderate persistent asthma Currently on Breztri 2 puffs twice a day and using albuterol a few times per week with good benefit.  Denies any ER/urgent care visits or prednisone use since the last visit.  Recurrent infections Forgot to get pneumonia vaccine. No antibiotics since last visit.    Gastroesophageal reflux disease Better with omeprazole '40mg'$  daily.  Seasonal allergic rhinitis due to pollen Taking Singulair daily and Flonase as needed.  Not taking any daily antihistamines.    History of nasal polypectomy No issues.   Assessment and Plan: Lindsay Mays is a 46 y.o. female with: Post-COVID chronic cough Past history - Diagnosed with COVID-19 in January 2021 and developed a persistent cough afterwards.  Evaluated by pulmonology and diagnosed with asthma. Up-to-date with COVID-19 vaccination. Covid-19 in July 2022 again. CT chest  showed nodular densities.  5 courses of prednisone in 2022. Bloodwork negative to alpha-1 antitrypsin, ANA, and ANCA. Interim history - still coughing and no additional improvement since last visit.  Take prednisone '40mg'$  daily x 2 days, '30mg'$  daily x 2 days, '20mg'$  daily x 2 days and '10mg'$  daily x 2 days. If coughing is not improved: Try flutter valve, try new rescue inhaler (Airsupra) 2 puffs every 4-6 hours as needed.  Sample given. Patient is also due for repeat CT chest and may need referral back to pulmonology.   Moderate persistent asthma without complication Past history - Diagnosed with COVID-19 in January 2021 and developed a persistent cough afterwards.  Evaluated by pulmonology and diagnosed with asthma. Up-to-date with COVID-19 vaccination. Covid-19 in July 2022. 5 courses of prednisone in 2022. CT chest showed nodular densities. Eos 100, Ige 116. Dislikes Trelegy. Interim history - still coughing but unsure about starting biologics.  Today's spirometry was normal. Daily controller medication(s):  Breztri 2 puffs twice a day with spacer and rinse mouth afterwards.  During upper respiratory infections/asthma flares:  Use albuterol 2 puffs or albuterol nebulizer twice a day for 1-2 weeks until symptoms subside.  If you need to use your albuterol nebulizer machine back to back within 15-30 minutes with no relief then please go to the ER/urgent care for further evaluation.  May use albuterol rescue inhaler 2 puffs or nebulizer every 4 to 6 hours as needed for shortness of breath, chest tightness, coughing, and wheezing. May use albuterol rescue inhaler 2 puffs 5 to 15 minutes prior to strenuous physical activities. Monitor frequency of use.   Recurrent  infections Past history - Normal immunoglobulin levels, low pneumococcal titers.  Interim history - no antibiotics, forgot to get pneumovax. Keep track of infections. Get pneumonia vaccine - after the next visit.  Get bloodwork 4 weeks after  vaccine.   Gastroesophageal reflux disease Past history - Globus sensation resolved with omeprazole. Continue lifestyle and dietary modifications. Omeprazole '40mg'$  daily in the morning. Nothing to eat or drink for 30 minutes.  Seasonal allergic rhinitis due to pollen Past history - Rhinoconjunctivitis symptoms for many years mainly in the spring and fall but now worsening since May.  2021 bloodwork was negative. 2021 skin testing showed: Positive to grass pollen only. S/p polypectomy and deviated septum repair in 2022. 2023 bloodwork negative to environmental allergy panel.  Interim history - only takes Singulair daily.  Continue environmental control measures as below. May use over the counter antihistamines such as Zyrtec (cetirizine), Claritin (loratadine), Allegra (fexofenadine), or Xyzal (levocetirizine) daily as needed. May take twice a day if needed. Continue Singulair (montelukast) '10mg'$  daily at night. Use Flonase (fluticasone) nasal spray 1 spray per nostril twice a day as needed for nasal congestion.  Nasal saline spray (i.e., Simply Saline) or nasal saline lavage (i.e., NeilMed) is recommended as needed and prior to medicated nasal sprays.  History of nasal polypectomy Past history - polypectomy in January 2022. Only use if needed: Xhance (fluticasone) nasal spray 1-2 sprays per nostril twice a day as needed for nasal congestion.   Return in about 4 weeks (around 07/27/2022).  Meds ordered this encounter  Medications   predniSONE (DELTASONE) 10 MG tablet    Sig: Take prednisone '40mg'$  daily x 2 days, '30mg'$  daily x 2 days, '20mg'$  daily x 2 days and '10mg'$  daily x 2 days.    Dispense:  20 tablet    Refill:  0   Lab Orders  No laboratory test(s) ordered today    Diagnostics: Spirometry:  Tracings reviewed. Her effort: Good reproducible efforts. FVC: 3.24L FEV1: 2.69L, 77% predicted FEV1/FVC ratio: 83% Interpretation: Spirometry consistent with normal pattern.  Please see  scanned spirometry results for details.  Medication List:  Current Outpatient Medications  Medication Sig Dispense Refill   albuterol (PROVENTIL) (2.5 MG/3ML) 0.083% nebulizer solution Take 3 mLs (2.5 mg total) by nebulization every 4 (four) hours as needed for wheezing or shortness of breath (coughing fits). 75 mL 2   albuterol (VENTOLIN HFA) 108 (90 Base) MCG/ACT inhaler Inhale 2 puffs into the lungs every 4 (four) hours as needed for wheezing or shortness of breath (coughing fits). 54 g 0   Budeson-Glycopyrrol-Formoterol (BREZTRI AEROSPHERE) 160-9-4.8 MCG/ACT AERO Inhale 2 puffs into the lungs in the morning and at bedtime. with spacer and rinse mouth afterwards. 32.1 g 2   levonorgestrel (MIRENA) 20 MCG/24HR IUD by Intrauterine route.     montelukast (SINGULAIR) 10 MG tablet Take 1 tablet (10 mg total) by mouth at bedtime. 90 tablet 2   Multiple Vitamin (MULTIVITAMIN PO) Take 2 tablets by mouth daily at 6 (six) AM.     omeprazole (PRILOSEC) 40 MG capsule Take 1 capsule (40 mg total) by mouth daily. 90 capsule 2   predniSONE (DELTASONE) 10 MG tablet Take prednisone '40mg'$  daily x 2 days, '30mg'$  daily x 2 days, '20mg'$  daily x 2 days and '10mg'$  daily x 2 days. 20 tablet 0   cetirizine (ZYRTEC) 10 MG tablet Take 10 mg by mouth daily as needed for allergies. (Patient not taking: Reported on 06/29/2022)     No current facility-administered medications for  this visit.   Allergies: No Known Allergies I reviewed her past medical history, social history, family history, and environmental history and no significant changes have been reported from her previous visit.  Review of Systems  Constitutional:  Negative for appetite change, chills, fever and unexpected weight change.  HENT:  Negative for congestion, rhinorrhea and sneezing.   Eyes:  Negative for itching.  Respiratory:  Positive for cough. Negative for chest tightness, shortness of breath and wheezing.   Cardiovascular:  Negative for chest pain.   Gastrointestinal:  Negative for abdominal pain.  Genitourinary:  Negative for difficulty urinating.  Skin:  Negative for rash.  Allergic/Immunologic: Positive for environmental allergies.  Neurological:  Negative for headaches.    Objective: BP 100/68   Pulse 78   Temp 98.2 F (36.8 C)   Resp 16   Ht '5\' 6"'$  (1.676 m)   Wt 152 lb 8 oz (69.2 kg)   SpO2 97%   BMI 24.61 kg/m  Body mass index is 24.61 kg/m. Physical Exam Vitals and nursing note reviewed.  Constitutional:      Appearance: Normal appearance. She is well-developed.  HENT:     Head: Normocephalic and atraumatic.     Right Ear: Tympanic membrane and external ear normal.     Left Ear: Tympanic membrane and external ear normal.     Nose: Nose normal.     Mouth/Throat:     Mouth: Mucous membranes are moist.  Eyes:     Conjunctiva/sclera: Conjunctivae normal.  Cardiovascular:     Rate and Rhythm: Normal rate and regular rhythm.     Heart sounds: Normal heart sounds. No murmur heard. Pulmonary:     Effort: Pulmonary effort is normal.     Breath sounds: Rhonchi present. No wheezing or rales.     Comments: Rhonchi noted on right side which resolved after instructed patient to cough a few times.  Musculoskeletal:     Cervical back: Neck supple.  Skin:    General: Skin is warm.     Findings: No rash.  Neurological:     Mental Status: She is alert and oriented to person, place, and time.  Psychiatric:        Behavior: Behavior normal.    Previous notes and tests were reviewed. The plan was reviewed with the patient/family, and all questions/concerned were addressed.  It was my pleasure to see Oaklie today and participate in her care. Please feel free to contact me with any questions or concerns.  Sincerely,  Rexene Alberts, DO Allergy & Immunology  [facility] office: [place] office: [phone]

## 2022-06-29 ENCOUNTER — Ambulatory Visit (INDEPENDENT_AMBULATORY_CARE_PROVIDER_SITE_OTHER): Payer: BC Managed Care – PPO | Admitting: Allergy

## 2022-06-29 ENCOUNTER — Encounter: Payer: Self-pay | Admitting: Allergy

## 2022-06-29 VITALS — BP 100/68 | HR 78 | Temp 98.2°F | Resp 16 | Ht 66.0 in | Wt 152.5 lb

## 2022-06-29 DIAGNOSIS — J4540 Moderate persistent asthma, uncomplicated: Secondary | ICD-10-CM | POA: Diagnosis not present

## 2022-06-29 DIAGNOSIS — J301 Allergic rhinitis due to pollen: Secondary | ICD-10-CM

## 2022-06-29 DIAGNOSIS — B999 Unspecified infectious disease: Secondary | ICD-10-CM

## 2022-06-29 DIAGNOSIS — R053 Chronic cough: Secondary | ICD-10-CM | POA: Diagnosis not present

## 2022-06-29 DIAGNOSIS — U099 Post covid-19 condition, unspecified: Secondary | ICD-10-CM

## 2022-06-29 DIAGNOSIS — Z8709 Personal history of other diseases of the respiratory system: Secondary | ICD-10-CM

## 2022-06-29 DIAGNOSIS — K219 Gastro-esophageal reflux disease without esophagitis: Secondary | ICD-10-CM

## 2022-06-29 MED ORDER — PREDNISONE 10 MG PO TABS
ORAL_TABLET | ORAL | 0 refills | Status: DC
Start: 2022-06-29 — End: 2022-08-03

## 2022-06-29 NOTE — Assessment & Plan Note (Signed)
Past history - Normal immunoglobulin levels, low pneumococcal titers.  Interim history - no antibiotics, forgot to get pneumovax. Marland Kitchen Keep track of infections. . Get pneumonia vaccine - after the next visit.  o Get bloodwork 4 weeks after vaccine.

## 2022-06-29 NOTE — Assessment & Plan Note (Signed)
Past history - polypectomy in January 2022.  Only use if needed: Xhance (fluticasone) nasal spray 1-2 sprays per nostril twice a day as needed for nasal congestion.

## 2022-06-29 NOTE — Assessment & Plan Note (Signed)
Past history - Diagnosed with COVID-19 in January 2021 and developed a persistent cough afterwards.  Evaluated by pulmonology and diagnosed with asthma. Up-to-date with COVID-19 vaccination. Covid-19 in July 2022. 5 courses of prednisone in 2022. CT chest showed nodular densities. Eos 100, Ige 116. Dislikes Trelegy. Interim history - still coughing but unsure about starting biologics.  . Today's spirometry was normal. . Daily controller medication(s):  o Breztri 2 puffs twice a day with spacer and rinse mouth afterwards.  . During upper respiratory infections/asthma flares:  o Use albuterol 2 puffs or albuterol nebulizer twice a day for 1-2 weeks until symptoms subside.  o If you need to use your albuterol nebulizer machine back to back within 15-30 minutes with no relief then please go to the ER/urgent care for further evaluation.  . May use albuterol rescue inhaler 2 puffs or nebulizer every 4 to 6 hours as needed for shortness of breath, chest tightness, coughing, and wheezing. May use albuterol rescue inhaler 2 puffs 5 to 15 minutes prior to strenuous physical activities. Monitor frequency of use.

## 2022-06-29 NOTE — Assessment & Plan Note (Signed)
Past history - Globus sensation resolved with omeprazole. Continue lifestyle and dietary modifications. Omeprazole 40mg daily in the morning. Nothing to eat or drink for 30 minutes. 

## 2022-06-29 NOTE — Assessment & Plan Note (Signed)
Past history - Rhinoconjunctivitis symptoms for many years mainly in the spring and fall but now worsening since May.  2021 bloodwork was negative. 2021 skin testing showed: Positive to grass pollen only. S/p polypectomy and deviated septum repair in 2022. 2023 bloodwork negative to environmental allergy panel.  Interim history - only takes Singulair daily.   Continue environmental control measures as below.  May use over the counter antihistamines such as Zyrtec (cetirizine), Claritin (loratadine), Allegra (fexofenadine), or Xyzal (levocetirizine) daily as needed. May take twice a day if needed. . Continue Singulair (montelukast) '10mg'$  daily at night. . Use Flonase (fluticasone) nasal spray 1 spray per nostril twice a day as needed for nasal congestion.   Nasal saline spray (i.e., Simply Saline) or nasal saline lavage (i.e., NeilMed) is recommended as needed and prior to medicated nasal sprays.

## 2022-06-29 NOTE — Assessment & Plan Note (Signed)
Past history - Diagnosed with COVID-19 in January 2021 and developed a persistent cough afterwards.  Evaluated by pulmonology and diagnosed with asthma. Up-to-date with COVID-19 vaccination. Covid-19 in July 2022 again. CT chest showed nodular densities.  5 courses of prednisone in 2022. Bloodwork negative to alpha-1 antitrypsin, ANA, and ANCA. Interim history - still coughing and no additional improvement since last visit.  . Take prednisone '40mg'$  daily x 2 days, '30mg'$  daily x 2 days, '20mg'$  daily x 2 days and '10mg'$  daily x 2 days. . If coughing is not improved: o Try flutter valve, try new rescue inhaler (Airsupra) 2 puffs every 4-6 hours as needed.  Sample given. . Patient is also due for repeat CT chest and may need referral back to pulmonology.

## 2022-06-29 NOTE — Patient Instructions (Addendum)
Asthma: Take prednisone '40mg'$  daily x 2 days, '30mg'$  daily x 2 days, '20mg'$  daily x 2 days and '10mg'$  daily x 2 days.  If coughing is not improved, Then try flutter valve, try new rescue inhaler (Airsupra) 2 puffs every 4-6 hours as needed.  Sample given. https://www.respiratorytherapyzone.com/acapella-flutter-valve/ You can buy this online. ReportNation.uy  Daily controller medication(s):  Breztri 2 puffs twice a day with spacer and rinse mouth afterwards.  During upper respiratory infections/asthma flares:  Use albuterol 2 puffs or albuterol nebulizer twice a day for 1-2 weeks until symptoms subside.  If you need to use your albuterol nebulizer machine back to back within 15-30 minutes with no relief then please go to the ER/urgent care for further evaluation.  May use albuterol rescue inhaler 2 puffs or nebulizer every 4 to 6 hours as needed for shortness of breath, chest tightness, coughing, and wheezing. May use albuterol rescue inhaler 2 puffs 5 to 15 minutes prior to strenuous physical activities. Monitor frequency of use.  Asthma control goals:  Full participation in all desired activities (may need albuterol before activity) Albuterol use two times or less a week on average (not counting use with activity) Cough interfering with sleep two times or less a month Oral steroids no more than once a year No hospitalizations   Environmental allergies 2021 skin testing positive to grass pollen.  Continue environmental control measures as below. May use over the counter antihistamines such as Zyrtec (cetirizine), Claritin (loratadine), Allegra (fexofenadine), or Xyzal (levocetirizine) daily as needed. May take twice a day if needed. Continue Singulair (montelukast) '10mg'$  daily at night. Use Flonase (fluticasone) nasal spray 1 spray per  nostril twice a day as needed for nasal congestion.  Nasal saline spray (i.e., Simply Saline) or nasal saline lavage (i.e., NeilMed) is recommended as needed and prior to medicated nasal sprays.  History of polyps Only use if needed: Xhance (fluticasone) nasal spray 1-2 sprays per nostril twice a day as needed for nasal congestion.   Heartburn: Continue lifestyle and dietary modifications. Omeprazole '40mg'$  daily in the morning. Nothing to eat or drink for 30 minutes.  Infections Keep track of infections. Get pneumonia vaccine - after the next visit.  Get bloodwork 4 weeks after vaccine.   Follow up in 1 months or sooner if needed.  Will need repeat CT scan if no improvement.   Reducing Pollen Exposure Pollen seasons: trees (spring), grass (summer) and ragweed/weeds (fall). Keep windows closed in your home and car to lower pollen exposure.  Install air conditioning in the bedroom and throughout the house if possible.  Avoid going out in dry windy days - especially early morning. Pollen counts are highest between 5 - 10 AM and on dry, hot and windy days.  Save outside activities for late afternoon or after a heavy rain, when pollen levels are lower.  Avoid mowing of grass if you have grass pollen allergy. Be aware that pollen can also be transported indoors on people and pets.  Dry your clothes in an automatic dryer rather than hanging them outside where they might collect pollen.  Rinse hair and eyes before bedtime.

## 2022-07-13 ENCOUNTER — Other Ambulatory Visit: Payer: Self-pay

## 2022-07-13 MED ORDER — ALBUTEROL SULFATE HFA 108 (90 BASE) MCG/ACT IN AERS: 2.0000 | INHALATION_SPRAY | RESPIRATORY_TRACT | 1 refills | Status: DC | PRN

## 2022-08-02 NOTE — Progress Notes (Unsigned)
Follow Up Note  RE: Lindsay Mays MRN: [id number] DOB: 07/21/1976 Date of Office Visit: 08/03/2022  Referring provider: Ma Hillock, DO Primary care provider: Ma Hillock, DO  Chief Complaint: No chief complaint on file.  History of Present Illness: I had the pleasure of seeing Lindsay Mays for a follow up visit at the [facility] on 08/02/2022. She is a 46 y.o. female, who is being followed for chronic cough, asthma, recurrent infections, GERD, allergic rhinitis and history of nasal polypectomy. Her previous allergy office visit was on 06/29/2022 with Dr. Maudie Mercury. Today is a regular follow up visit.  dupixent? tezspire? CT and pulm referral  Post-COVID chronic cough Past history - Diagnosed with COVID-19 in January 2021 and developed a persistent cough afterwards.  Evaluated by pulmonology and diagnosed with asthma. Up-to-date with COVID-19 vaccination. Covid-19 in July 2022 again. CT chest showed nodular densities.  5 courses of prednisone in 2022. Bloodwork negative to alpha-1 antitrypsin, ANA, and ANCA. Interim history - still coughing and no additional improvement since last visit.  Take prednisone '40mg'$  daily x 2 days, '30mg'$  daily x 2 days, '20mg'$  daily x 2 days and '10mg'$  daily x 2 days. If coughing is not improved: Try flutter valve, try new rescue inhaler (Airsupra) 2 puffs every 4-6 hours as needed.  Sample given. Patient is also due for repeat CT chest and may need referral back to pulmonology.    Moderate persistent asthma without complication Past history - Diagnosed with COVID-19 in January 2021 and developed a persistent cough afterwards.  Evaluated by pulmonology and diagnosed with asthma. Up-to-date with COVID-19 vaccination. Covid-19 in July 2022. 5 courses of prednisone in 2022. CT chest showed nodular densities. Eos 100, Ige 116. Dislikes Trelegy. Interim history - still coughing but unsure about starting biologics.  Today's spirometry was  normal. Daily controller medication(s):  Breztri 2 puffs twice a day with spacer and rinse mouth afterwards.  During upper respiratory infections/asthma flares:  Use albuterol 2 puffs or albuterol nebulizer twice a day for 1-2 weeks until symptoms subside.  If you need to use your albuterol nebulizer machine back to back within 15-30 minutes with no relief then please go to the ER/urgent care for further evaluation.  May use albuterol rescue inhaler 2 puffs or nebulizer every 4 to 6 hours as needed for shortness of breath, chest tightness, coughing, and wheezing. May use albuterol rescue inhaler 2 puffs 5 to 15 minutes prior to strenuous physical activities. Monitor frequency of use.    Recurrent infections Past history - Normal immunoglobulin levels, low pneumococcal titers.  Interim history - no antibiotics, forgot to get pneumovax. Keep track of infections. Get pneumonia vaccine - after the next visit.  Get bloodwork 4 weeks after vaccine.    Gastroesophageal reflux disease Past history - Globus sensation resolved with omeprazole. Continue lifestyle and dietary modifications. Omeprazole '40mg'$  daily in the morning. Nothing to eat or drink for 30 minutes.   Seasonal allergic rhinitis due to pollen Past history - Rhinoconjunctivitis symptoms for many years mainly in the spring and fall but now worsening since May.  2021 bloodwork was negative. 2021 skin testing showed: Positive to grass pollen only. S/p polypectomy and deviated septum repair in 2022. 2023 bloodwork negative to environmental allergy panel.  Interim history - only takes Singulair daily.  Continue environmental control measures as below. May use over the counter antihistamines such as Zyrtec (cetirizine), Claritin (loratadine), Allegra (fexofenadine), or Xyzal (levocetirizine) daily as needed.  May take twice a day if needed. Continue Singulair (montelukast) '10mg'$  daily at night. Use Flonase (fluticasone) nasal spray 1 spray per  nostril twice a day as needed for nasal congestion.  Nasal saline spray (i.e., Simply Saline) or nasal saline lavage (i.e., NeilMed) is recommended as needed and prior to medicated nasal sprays.   History of nasal polypectomy Past history - polypectomy in January 2022. Only use if needed: Xhance (fluticasone) nasal spray 1-2 sprays per nostril twice a day as needed for nasal congestion.    Return in about 4 weeks (around 07/27/2022).  Assessment and Plan: Lindsay Mays is a 46 y.o. female with: No problem-specific Assessment & Plan notes found for this encounter.  No follow-ups on file.  No orders of the defined types were placed in this encounter.  Lab Orders  No laboratory test(s) ordered today    Diagnostics: Spirometry:  Tracings reviewed. Her effort: {Blank single:19197::"Good reproducible efforts.","It was hard to get consistent efforts and there is a question as to whether this reflects a maximal maneuver.","Poor effort, data can not be interpreted."} FVC: ***L FEV1: ***L, ***% predicted FEV1/FVC ratio: ***% Interpretation: {Blank single:19197::"Spirometry consistent with mild obstructive disease","Spirometry consistent with moderate obstructive disease","Spirometry consistent with severe obstructive disease","Spirometry consistent with possible restrictive disease","Spirometry consistent with mixed obstructive and restrictive disease","Spirometry uninterpretable due to technique","Spirometry consistent with normal pattern","No overt abnormalities noted given today's efforts"}.  Please see scanned spirometry results for details.  Skin Testing: {Blank single:19197::"Select foods","Environmental allergy panel","Environmental allergy panel and select foods","Food allergy panel","None","Deferred due to recent antihistamines use"}. *** Results discussed with patient/family.   Medication List:  Current Outpatient Medications  Medication Sig Dispense Refill  . albuterol (PROVENTIL) (2.5  MG/3ML) 0.083% nebulizer solution Take 3 mLs (2.5 mg total) by nebulization every 4 (four) hours as needed for wheezing or shortness of breath (coughing fits). 75 mL 2  . albuterol (VENTOLIN HFA) 108 (90 Base) MCG/ACT inhaler Inhale 2 puffs into the lungs every 4 (four) hours as needed for wheezing or shortness of breath (coughing fits). 8 g 1  . Budeson-Glycopyrrol-Formoterol (BREZTRI AEROSPHERE) 160-9-4.8 MCG/ACT AERO Inhale 2 puffs into the lungs in the morning and at bedtime. with spacer and rinse mouth afterwards. 32.1 g 2  . cetirizine (ZYRTEC) 10 MG tablet Take 10 mg by mouth daily as needed for allergies. (Patient not taking: Reported on 06/29/2022)    . levonorgestrel (MIRENA) 20 MCG/24HR IUD by Intrauterine route.    . montelukast (SINGULAIR) 10 MG tablet Take 1 tablet (10 mg total) by mouth at bedtime. 90 tablet 2  . Multiple Vitamin (MULTIVITAMIN PO) Take 2 tablets by mouth daily at 6 (six) AM.    . omeprazole (PRILOSEC) 40 MG capsule Take 1 capsule (40 mg total) by mouth daily. 90 capsule 2  . predniSONE (DELTASONE) 10 MG tablet Take prednisone '40mg'$  daily x 2 days, '30mg'$  daily x 2 days, '20mg'$  daily x 2 days and '10mg'$  daily x 2 days. 20 tablet 0   No current facility-administered medications for this visit.   Allergies: No Known Allergies I reviewed her past medical history, social history, family history, and environmental history and no significant changes have been reported from her previous visit.  Review of Systems  Constitutional:  Negative for appetite change, chills, fever and unexpected weight change.  HENT:  Negative for congestion, rhinorrhea and sneezing.   Eyes:  Negative for itching.  Respiratory:  Positive for cough. Negative for chest tightness, shortness of breath and wheezing.   Cardiovascular:  Negative for  chest pain.  Gastrointestinal:  Negative for abdominal pain.  Genitourinary:  Negative for difficulty urinating.  Skin:  Negative for rash.   Allergic/Immunologic: Positive for environmental allergies.  Neurological:  Negative for headaches.   Objective: There were no vitals taken for this visit. There is no height or weight on file to calculate BMI. Physical Exam Vitals and nursing note reviewed.  Constitutional:      Appearance: Normal appearance. She is well-developed.  HENT:     Head: Normocephalic and atraumatic.     Right Ear: Tympanic membrane and external ear normal.     Left Ear: Tympanic membrane and external ear normal.     Nose: Nose normal.     Mouth/Throat:     Mouth: Mucous membranes are moist.  Eyes:     Conjunctiva/sclera: Conjunctivae normal.  Cardiovascular:     Rate and Rhythm: Normal rate and regular rhythm.     Heart sounds: Normal heart sounds. No murmur heard. Pulmonary:     Effort: Pulmonary effort is normal.     Breath sounds: Rhonchi present. No wheezing or rales.     Comments: Rhonchi noted on right side which resolved after instructed patient to cough a few times.  Musculoskeletal:     Cervical back: Neck supple.  Skin:    General: Skin is warm.     Findings: No rash.  Neurological:     Mental Status: She is alert and oriented to person, place, and time.  Psychiatric:        Behavior: Behavior normal.  Previous notes and tests were reviewed. The plan was reviewed with the patient/family, and all questions/concerned were addressed.  It was my pleasure to see Lindsay Mays today and participate in her care. Please feel free to contact me with any questions or concerns.  Sincerely,  Rexene Alberts, DO Allergy & Immunology  [facility] office: [place] office: [phone]

## 2022-08-03 ENCOUNTER — Other Ambulatory Visit: Payer: Self-pay

## 2022-08-03 ENCOUNTER — Ambulatory Visit (INDEPENDENT_AMBULATORY_CARE_PROVIDER_SITE_OTHER): Payer: BC Managed Care – PPO | Admitting: Allergy

## 2022-08-03 ENCOUNTER — Encounter: Payer: Self-pay | Admitting: Allergy

## 2022-08-03 VITALS — BP 104/66 | HR 94 | Temp 98.6°F | Resp 18

## 2022-08-03 DIAGNOSIS — K219 Gastro-esophageal reflux disease without esophagitis: Secondary | ICD-10-CM

## 2022-08-03 DIAGNOSIS — J301 Allergic rhinitis due to pollen: Secondary | ICD-10-CM

## 2022-08-03 DIAGNOSIS — U099 Post covid-19 condition, unspecified: Secondary | ICD-10-CM

## 2022-08-03 DIAGNOSIS — R053 Chronic cough: Secondary | ICD-10-CM

## 2022-08-03 DIAGNOSIS — J4540 Moderate persistent asthma, uncomplicated: Secondary | ICD-10-CM

## 2022-08-03 DIAGNOSIS — Z8709 Personal history of other diseases of the respiratory system: Secondary | ICD-10-CM

## 2022-08-03 DIAGNOSIS — B999 Unspecified infectious disease: Secondary | ICD-10-CM

## 2022-08-03 MED ORDER — PNEUMOVAX 23 25 MCG/0.5ML IJ INJ: 0.5000 mL | INJECTION | Freq: Once | INTRAMUSCULAR | 0 refills | Status: AC

## 2022-08-03 NOTE — Assessment & Plan Note (Signed)
Past history - Rhinoconjunctivitis symptoms for many years mainly in the spring and fall but now worsening since May.  2021 bloodwork was negative. 2021 skin testing showed: Positive to grass pollen only. S/p polypectomy and deviated septum repair in 2022. 2023 bloodwork negative to environmental allergy panel.  Interim history - stable.  Continue environmental control measures as below. May use over the counter antihistamines such as Zyrtec (cetirizine), Claritin (loratadine), Allegra (fexofenadine), or Xyzal (levocetirizine) daily as needed. May take twice a day if needed. Continue Singulair (montelukast) '10mg'$  daily at night. Use Flonase (fluticasone) nasal spray 1 spray per nostril twice a day as needed for nasal congestion.  Nasal saline spray (i.e., Simply Saline) or nasal saline lavage (i.e., NeilMed) is recommended as needed and prior to medicated nasal sprays.

## 2022-08-03 NOTE — Assessment & Plan Note (Signed)
Past history - Globus sensation resolved with omeprazole. Continue lifestyle and dietary modifications. Omeprazole '40mg'$  daily in the morning. Nothing to eat or drink for 30 minutes.

## 2022-08-03 NOTE — Assessment & Plan Note (Signed)
Past history - polypectomy in January 2022. Interim history - sense of smell returned.  Only use if needed: Xhance (fluticasone) nasal spray 1-2 sprays per nostril twice a day as needed for nasal congestion.

## 2022-08-03 NOTE — Assessment & Plan Note (Addendum)
Past history - Diagnosed with COVID-19 in January 2021 and developed a persistent cough afterwards.  Evaluated by pulmonology and diagnosed with asthma. Up-to-date with COVID-19 vaccination. Covid-19 in July 2022. 5 courses of prednisone in 2022. CT chest showed nodular densities. Eos 100, Ige 116. Dislikes Trelegy. Interim history - still coughing/wheezing, using albuterol once a week. Today's spirometry showed some restriction - worse than before.  Daily controller medication(s):  Breztri 2 puffs twice a day with spacer and rinse mouth afterwards.  During upper respiratory infections/asthma flares:  Use albuterol 2 puffs or albuterol nebulizer twice a day for 1-2 weeks until symptoms subside.  If you need to use your albuterol nebulizer machine back to back within 15-30 minutes with no relief then please go to the ER/urgent care for further evaluation.  May use albuterol rescue inhaler 2 puffs or nebulizer every 4 to 6 hours as needed for shortness of breath, chest tightness, coughing, and wheezing. May use albuterol rescue inhaler 2 puffs 5 to 15 minutes prior to strenuous physical activities. Monitor frequency of use.

## 2022-08-03 NOTE — Assessment & Plan Note (Signed)
Past history - Diagnosed with COVID-19 in January 2021 and developed a persistent cough afterwards.  Evaluated by pulmonology and diagnosed with asthma. Up-to-date with COVID-19 vaccination. Covid-19 in July 2022 again. CT chest showed nodular densities.  5 courses of prednisone in 2022. Bloodwork negative to alpha-1 antitrypsin, ANA, and ANCA. Interim history - still coughing daily with yellowish phlegm and wheezing at times. Prednisone did not make any difference.  Get CT Chest. Follow up with pulmonology as well. Try flutter valve, try new rescue inhaler (Airsupra) 2 puffs every 4-6 hours as needed.

## 2022-08-03 NOTE — Patient Instructions (Addendum)
Asthma: Get CT Chest. Follow up with pulmonology as well.  Try flutter valve, try new rescue inhaler (Airsupra) 2 puffs every 4-6 hours as needed.  https://www.respiratorytherapyzone.com/acapella-flutter-valve/ You can buy this online. ReportNation.uy  Daily controller medication(s):  Breztri 2 puffs twice a day with spacer and rinse mouth afterwards.  During upper respiratory infections/asthma flares:  Use albuterol 2 puffs or albuterol nebulizer twice a day for 1-2 weeks until symptoms subside.  If you need to use your albuterol nebulizer machine back to back within 15-30 minutes with no relief then please go to the ER/urgent care for further evaluation.  May use albuterol rescue inhaler 2 puffs or nebulizer every 4 to 6 hours as needed for shortness of breath, chest tightness, coughing, and wheezing. May use albuterol rescue inhaler 2 puffs 5 to 15 minutes prior to strenuous physical activities. Monitor frequency of use.  Asthma control goals:  Full participation in all desired activities (may need albuterol before activity) Albuterol use two times or less a week on average (not counting use with activity) Cough interfering with sleep two times or less a month Oral steroids no more than once a year No hospitalizations   Environmental allergies 2021 skin testing positive to grass pollen.  Continue environmental control measures as below. May use over the counter antihistamines such as Zyrtec (cetirizine), Claritin (loratadine), Allegra (fexofenadine), or Xyzal (levocetirizine) daily as needed. May take twice a day if needed. Continue Singulair (montelukast) '10mg'$  daily at night. Use Flonase (fluticasone) nasal spray 1 spray per nostril twice a day as needed for nasal congestion.  Nasal saline spray (i.e., Simply Saline) or  nasal saline lavage (i.e., NeilMed) is recommended as needed and prior to medicated nasal sprays.  History of polyps Only use if needed: Xhance (fluticasone) nasal spray 1-2 sprays per nostril twice a day as needed for nasal congestion.   Heartburn: Continue lifestyle and dietary modifications. Omeprazole '40mg'$  daily in the morning. Nothing to eat or drink for 30 minutes.  Infections Keep track of infections. Get pneumonia vaccine - pneumovax 23.  Get bloodwork 4 weeks after vaccine.   Follow up in 1-2 months or sooner if needed.   Reducing Pollen Exposure Pollen seasons: trees (spring), grass (summer) and ragweed/weeds (fall). Keep windows closed in your home and car to lower pollen exposure.  Install air conditioning in the bedroom and throughout the house if possible.  Avoid going out in dry windy days - especially early morning. Pollen counts are highest between 5 - 10 AM and on dry, hot and windy days.  Save outside activities for late afternoon or after a heavy rain, when pollen levels are lower.  Avoid mowing of grass if you have grass pollen allergy. Be aware that pollen can also be transported indoors on people and pets.  Dry your clothes in an automatic dryer rather than hanging them outside where they might collect pollen.  Rinse hair and eyes before bedtime.

## 2022-08-03 NOTE — Assessment & Plan Note (Signed)
Past history - Normal immunoglobulin levels, low pneumococcal titers.  Interim history - no antibiotics. Up to date with flu vaccine. Not up to date with Covid-19 booster. Keep track of infections. Get pneumonia vaccine - pneumovax 23.  Get bloodwork 4 weeks after vaccine.

## 2022-08-05 ENCOUNTER — Telehealth: Payer: Self-pay | Admitting: Allergy

## 2022-08-05 DIAGNOSIS — B999 Unspecified infectious disease: Secondary | ICD-10-CM

## 2022-08-05 DIAGNOSIS — U099 Post covid-19 condition, unspecified: Secondary | ICD-10-CM

## 2022-08-05 NOTE — Telephone Encounter (Signed)
Tried to call patient and will send mychart message.

## 2022-08-20 DIAGNOSIS — B3731 Acute candidiasis of vulva and vagina: Secondary | ICD-10-CM | POA: Diagnosis not present

## 2022-08-20 DIAGNOSIS — J029 Acute pharyngitis, unspecified: Secondary | ICD-10-CM | POA: Diagnosis not present

## 2022-12-08 ENCOUNTER — Other Ambulatory Visit: Payer: Self-pay | Admitting: Allergy

## 2022-12-13 DIAGNOSIS — Z1231 Encounter for screening mammogram for malignant neoplasm of breast: Secondary | ICD-10-CM | POA: Diagnosis not present

## 2022-12-16 DIAGNOSIS — R922 Inconclusive mammogram: Secondary | ICD-10-CM | POA: Diagnosis not present

## 2022-12-29 IMAGING — DX DG CHEST 2V
2 series · 2 of 2 positions shown · non-contrast
Comparison: 10/12/2019

CLINICAL DATA: Left lower lobe crackles. Prior pneumonia. History
of asthma.

EXAM:
CHEST - 2 VIEW

[chest pa]
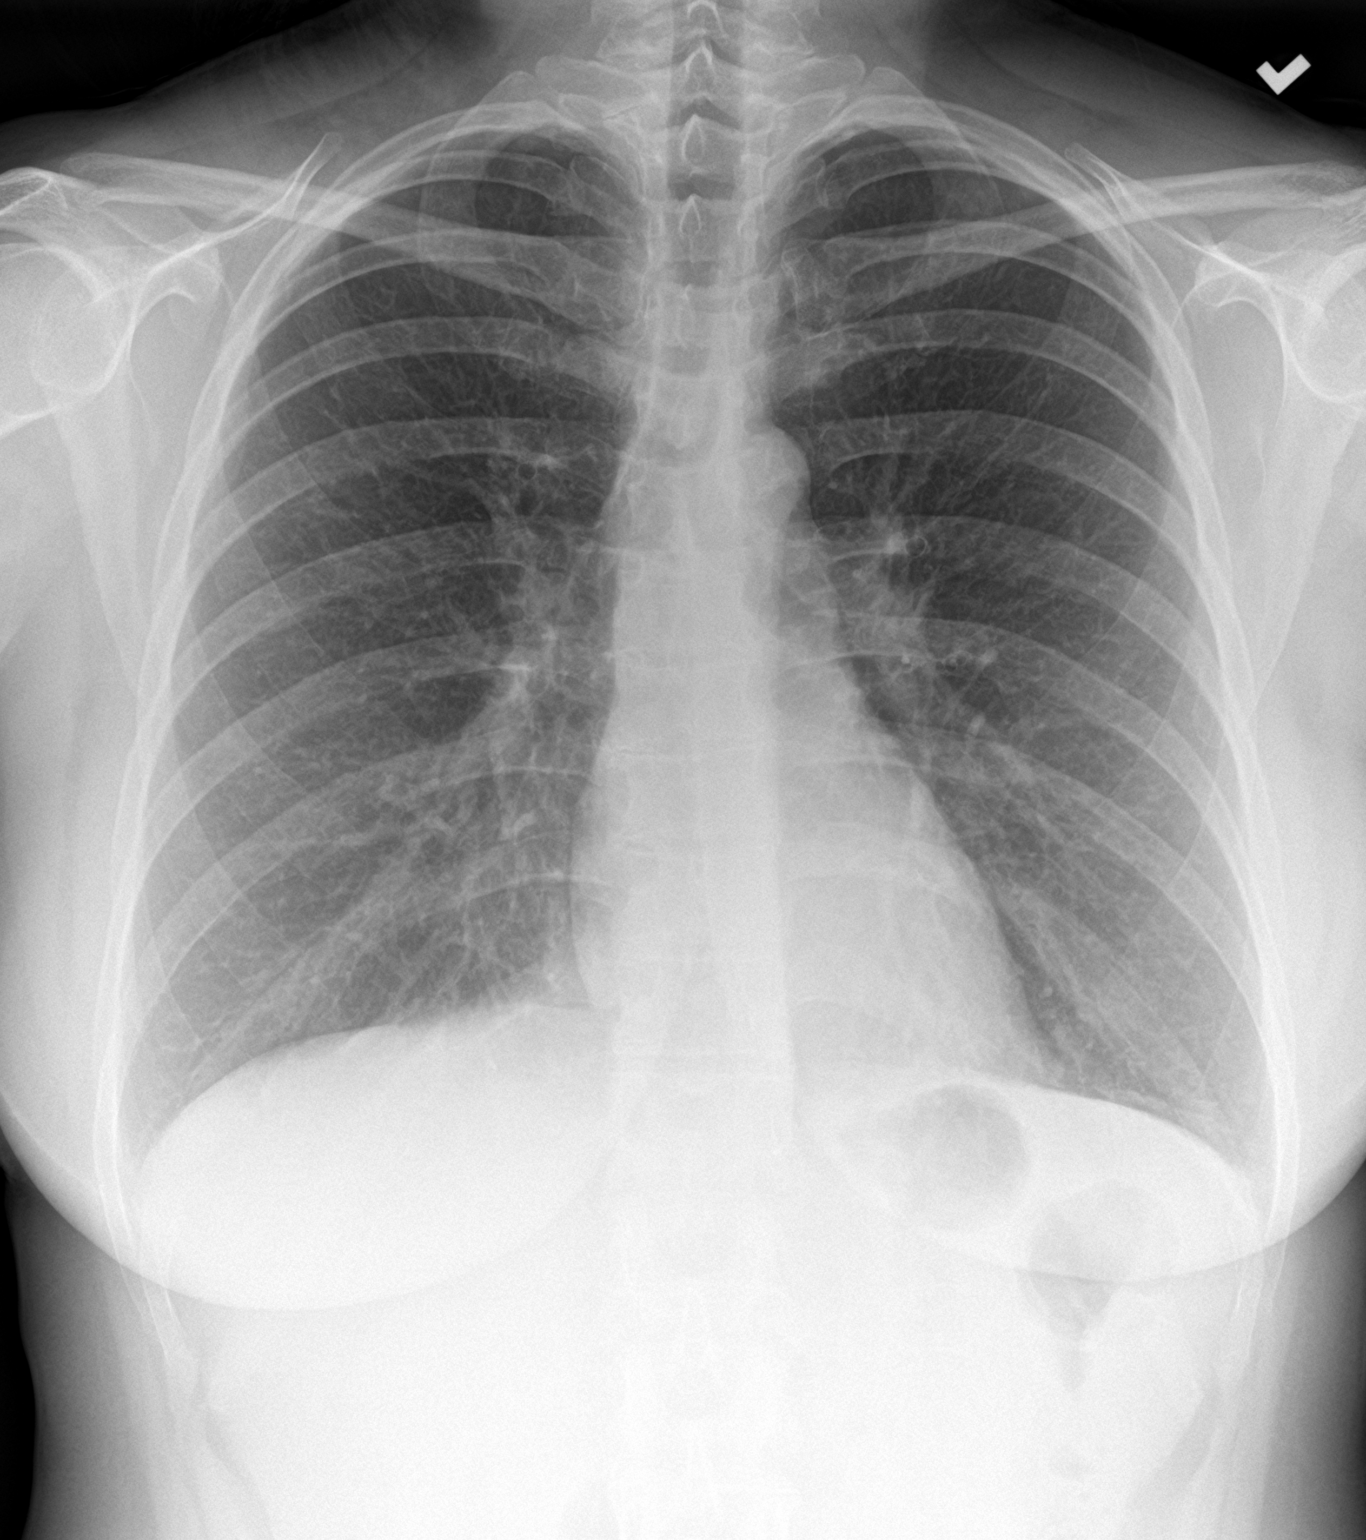

[chest lat]
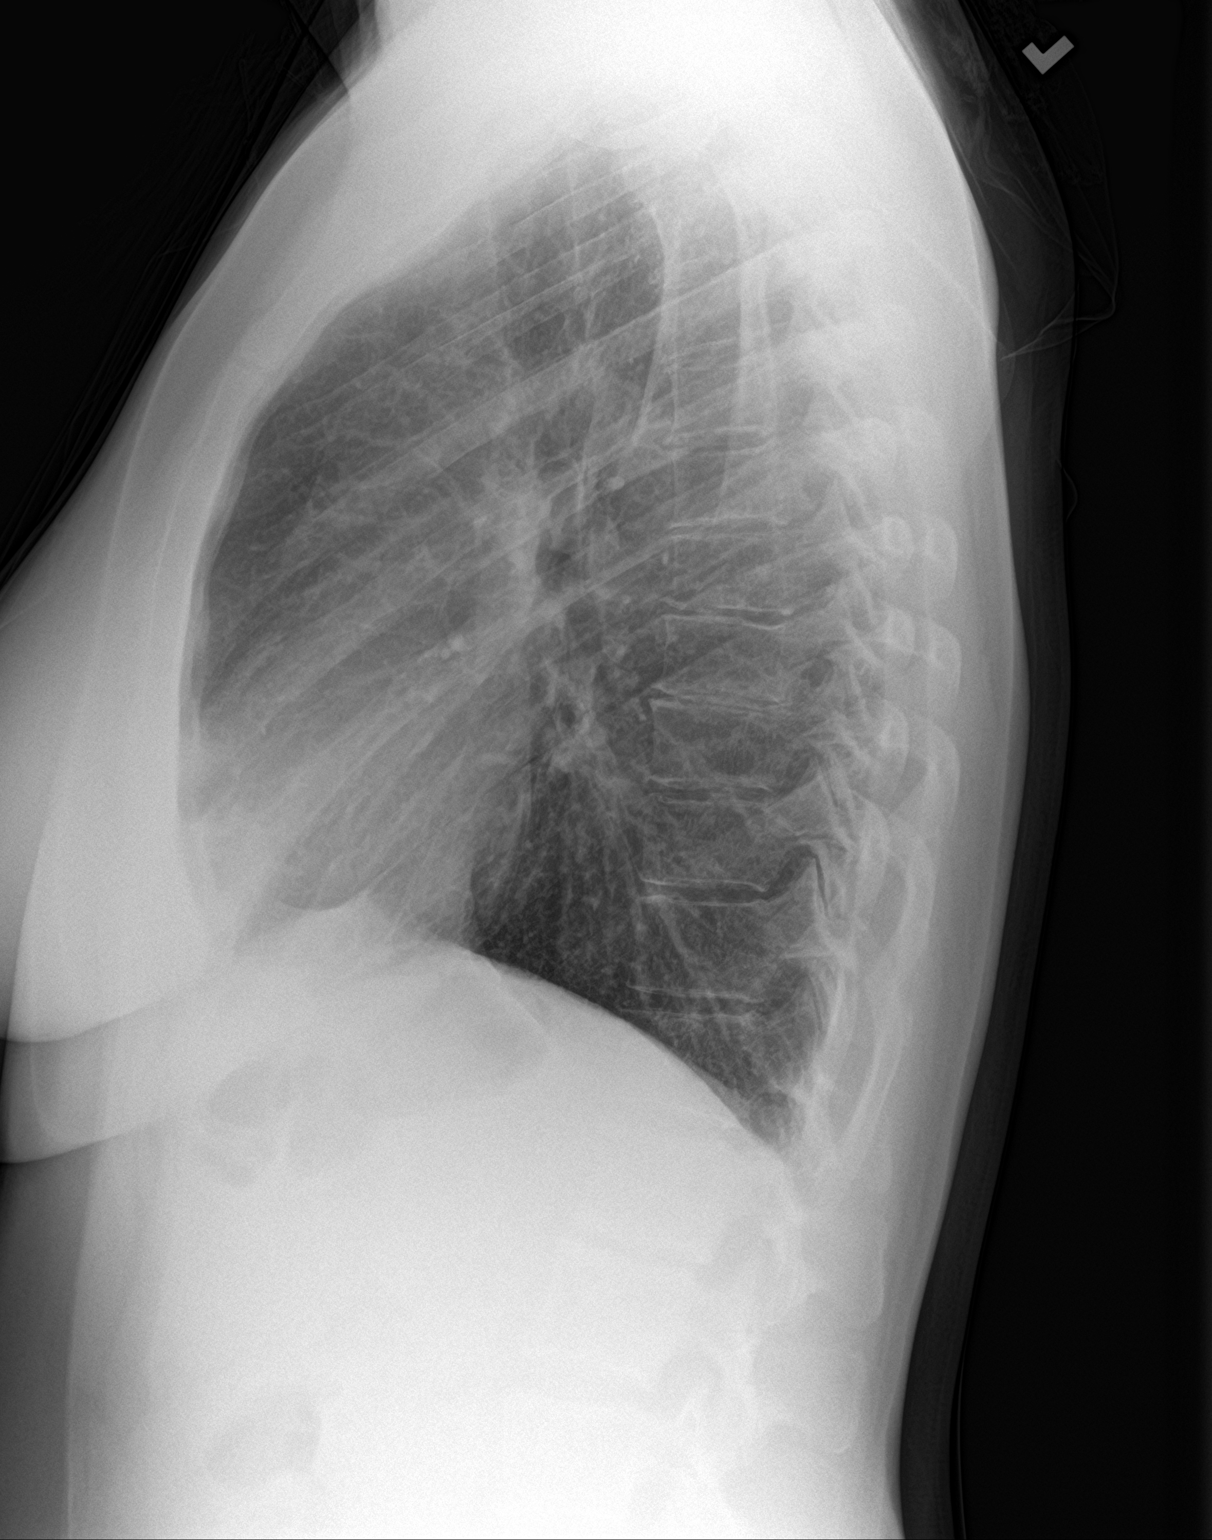

[2 of 2 positions shown; findings below may reference images not displayed]

FINDINGS: Midline trachea. Normal heart size and mediastinal contours. No
pleural effusion or pneumothorax. Diffuse peribronchial thickening.
Clear lungs.
IMPRESSION: No acute cardiopulmonary disease.

Interstitial thickening, likely related to the clinical history of
asthma.

## 2023-01-26 NOTE — Patient Instructions (Incomplete)
Post-COVID chronic cough Past history - Diagnosed with COVID-19 in January 2021 and developed a persistent cough afterwards.  Evaluated by pulmonology and diagnosed with asthma. Up-to-date with COVID-19 vaccination. Covid-19 in July 2022 again. CT chest showed nodular densities.  5 courses of prednisone in 2022. Bloodwork negative to alpha-1 antitrypsin, ANA, and ANCA.  Get CT Chest as ordered previously. Follow up with pulmonology as well. Try flutter valve, try new rescue inhaler (Airsupra) 2 puffs every 4-6 hours as needed.    Asthma Past history - Diagnosed with COVID-19 in January 2021 and developed a persistent cough afterwards.  Evaluated by pulmonology and diagnosed with asthma. Up-to-date with COVID-19 vaccination. Covid-19 in July 2022. 5 courses of prednisone in 2022. CT chest showed nodular densities. Eos 100, Ige 116. Dislikes Trelegy.  Daily controller medication(s):  Breztri 2 puffs twice a day with spacer and rinse mouth afterwards.  During upper respiratory infections/asthma flares:  Use albuterol 2 puffs or albuterol nebulizer twice a day for 1-2 weeks until symptoms subside.  If you need to use your albuterol nebulizer machine back to back within 15-30 minutes with no relief then please go to the ER/urgent care for further evaluation.  May use albuterol rescue inhaler 2 puffs or nebulizer every 4 to 6 hours as needed for shortness of breath, chest tightness, coughing, and wheezing. May use albuterol rescue inhaler 2 puffs 5 to 15 minutes prior to strenuous physical activities. Monitor frequency of use.    Seasonal allergic rhinitis due to pollen Past history - Rhinoconjunctivitis symptoms for many years mainly in the spring and fall but now worsening since May.  2021 bloodwork was negative. 2021 skin testing showed: Positive to grass pollen only. S/p polypectomy and deviated septum repair in 2022. 2023 bloodwork negative to environmental allergy panel.  Interim history - stable.   Continue environmental control measures as below. May use over the counter antihistamines such as Zyrtec (cetirizine), Claritin (loratadine), Allegra (fexofenadine), or Xyzal (levocetirizine) daily as needed. May take twice a day if needed. Continue Singulair (montelukast) 10mg  daily at night. Use Flonase (fluticasone) nasal spray 1 spray per nostril twice a day as needed for nasal congestion.  Nasal saline spray (i.e., Simply Saline) or nasal saline lavage (i.e., NeilMed) is recommended as needed and prior to medicated nasal sprays.   Gastroesophageal reflux disease Past history - Globus sensation resolved with omeprazole. Continue lifestyle and dietary modifications. Omeprazole 40mg  daily in the morning. Nothing to eat or drink for 30 minutes.   History of nasal polypectomy Past history - polypectomy in January 2022. Interim history - sense of smell returned.  Only use if needed: Xhance (fluticasone) nasal spray 1-2 sprays per nostril twice a day as needed for nasal congestion.    Recurrent infections Past history - Normal immunoglobulin levels, low pneumococcal titers.   Keep track of infections. Get pneumonia vaccine - pneumovax 23.  Get bloodwork 4 weeks after vaccine  Schedule a follow up appointment in months or sooner if needed

## 2023-01-27 ENCOUNTER — Encounter: Payer: Self-pay | Admitting: Family

## 2023-01-27 ENCOUNTER — Ambulatory Visit (INDEPENDENT_AMBULATORY_CARE_PROVIDER_SITE_OTHER): Payer: BC Managed Care – PPO | Admitting: Family

## 2023-01-27 VITALS — BP 88/62 | HR 90 | Temp 98.6°F | Resp 18 | Wt 146.8 lb

## 2023-01-27 DIAGNOSIS — J301 Allergic rhinitis due to pollen: Secondary | ICD-10-CM

## 2023-01-27 DIAGNOSIS — J4540 Moderate persistent asthma, uncomplicated: Secondary | ICD-10-CM

## 2023-01-27 DIAGNOSIS — K219 Gastro-esophageal reflux disease without esophagitis: Secondary | ICD-10-CM

## 2023-01-27 DIAGNOSIS — J479 Bronchiectasis, uncomplicated: Secondary | ICD-10-CM

## 2023-01-27 DIAGNOSIS — B999 Unspecified infectious disease: Secondary | ICD-10-CM

## 2023-01-27 DIAGNOSIS — Z9889 Other specified postprocedural states: Secondary | ICD-10-CM

## 2023-01-27 DIAGNOSIS — R053 Chronic cough: Secondary | ICD-10-CM

## 2023-01-27 DIAGNOSIS — Z8709 Personal history of other diseases of the respiratory system: Secondary | ICD-10-CM

## 2023-01-27 DIAGNOSIS — U099 Post covid-19 condition, unspecified: Secondary | ICD-10-CM

## 2023-01-27 DIAGNOSIS — A310 Pulmonary mycobacterial infection: Secondary | ICD-10-CM

## 2023-01-27 MED ORDER — OMEPRAZOLE 40 MG PO CPDR: 40.0000 mg | DELAYED_RELEASE_CAPSULE | Freq: Every day | ORAL | 1 refills | Status: DC

## 2023-01-27 MED ORDER — BREZTRI AEROSPHERE 160-9-4.8 MCG/ACT IN AERO: 2.0000 | INHALATION_SPRAY | Freq: Two times a day (BID) | RESPIRATORY_TRACT | 1 refills | Status: DC

## 2023-01-27 NOTE — Progress Notes (Signed)
[address] [place] [address] Dept: [phone]  FOLLOW UP NOTE  Patient ID: Lindsay Mays, female    DOB: 27-Oct-1975  Age: 47 y.o. MRN: [id number] Date of Office Visit: 01/27/2023  Assessment  Chief Complaint: Asthma (Chest x-ray needed /Labs for previous Pneumoniae 23 )  HPI Lindsay Mays is a 47 year old female who presents today for follow-up of moderate persistent asthma, post-COVID chronic cough, seasonal allergic rhinitis due to pollen, gastroesophageal reflux disease, history of nasal polypectomy, and recurrent infections.  She was last seen on August 03, 2022 by Dr. Selena Batten.  She denies any new diagnosis or surgery since her last office visit.  Moderate persistent asthma/post-COVID chronic cough: She reports that for the past 10 days she has decided to try the sample of Breztri using 2 puffs once a day to make the medication last.  Prior to that she was only using her albuterol.  She then realized that she was using her albuterol almost daily and more than what she needed to.  Within a couple hours of using Breztri she could tell a difference.  She has not had to use her albuterol since starting on the [place]. She reports that since starting the Breztri 2 puffs once a day that she has had a decrease in the amount of coughing.  She is still able to work out.  When she coughs it is productive with clear sputum.  The cough does wake her up at night sometimes.  She denies wheezing, tightness in chest, shortness of breath, fever, and chills.  Since her last office visit she has not required any systemic steroids or made any trips to the emergency room or urgent care due to breathing problems.  She did not ever get the CT chest that was ordered previously by Dr. Selena Batten.  She reports that she called to set up the appointment and that there was no order.  She called our office here and did not hear anything.  She would like to get this CT chest completed at [facility].  This is where  she has had previous chest imaging.  She has not seen her pulmonologist Dr. Su Monks since her last office visit.   Seasonal allergic rhinitis due to pollen: She is currently using an over-the-counter [employer] antihistamine.  She ran out of Singulair 10 mg at night and reports that she cannot tell a difference since being off this medication.  She also reports that she forgot about Flonase nasal spray and she might try this again.  She also has a sample of Xhance, but in the past when she tried that she could not tell a difference with its use.  She reports rhinorrhea sometimes.  It is clear in color.  She also has nasal congestion that alternates sides. The nasal congestion is predominantly on the left side.  She also has postnasal drip.  She has been treated for 1 sinus infection since we last saw her.  This last sinus infection was 2 months ago or more.  She has had a history of nasal polypectomy and reports that it was somewhere around 2021 when she had her last surgery.  Gastroesophageal reflux disease.  She reports that she is currently taking Pepcid since she is out of omeprazole 40 mg once a day.  When she was on omeprazole 40 mg once a day she could tell that it helped.  Right now she is having globus sensation and heartburn at times with Pepcid.  Recurrent  infections.  She has had 1 sinus infection since her last office visit and no other infections.  She reports that she did get her Pneumovax may be in December.  She got it at the same time when she got her COVID booster. After she got both injections she was so sick.  She is willing to get blood work to see if she responded to the Pneumovax.   Drug Allergies:  No Known Allergies  Review of Systems: Review of Systems  Constitutional:  Negative for chills and fever.  HENT:         Reports rhinorrhea sometimes that is clear now, also reports nasal congestion and post nasal drip  Eyes:        Denies itchy watery eyes  Respiratory:  Positive for  cough. Negative for shortness of breath and wheezing.        Reports reduced coughing with Breztri 2 puffs once a day. Cough is productive with clear sputum. Cough does wake her up at night sometimes. Denies wheezing, tightness in chest, and shortness of breath  Cardiovascular:  Negative for chest pain and palpitations.  Gastrointestinal:  Positive for heartburn.       Reports globus sensation and heartburn at times on Pepcid. Symptoms under better control when on omeprazole  Skin:  Negative for itching and rash.  Neurological:  Negative for headaches.  Endo/Heme/Allergies:  Positive for environmental allergies.     Physical Exam: BP (!) 88/62 (BP Location: Left Arm, Patient Position: Sitting, Cuff Size: Normal)   Pulse 90   Temp 98.6 F (37 C) (Temporal)   Resp 18   Wt 146 lb 12.8 oz (66.6 kg)   SpO2 98%   BMI 23.69 kg/m    Physical Exam Constitutional:      Appearance: Normal appearance.  HENT:     Head: Normocephalic and atraumatic.     Comments: Pharynx normal. Eyes normal. Ears normal. Nose: bilateral lower turbinates mildly edematous with no drainage noted    Right Ear: Tympanic membrane, ear canal and external ear normal.     Left Ear: Tympanic membrane, ear canal and external ear normal.     Mouth/Throat:     Mouth: Mucous membranes are moist.     Pharynx: Oropharynx is clear.  Eyes:     Conjunctiva/sclera: Conjunctivae normal.  Cardiovascular:     Rate and Rhythm: Regular rhythm.     Heart sounds: Normal heart sounds.  Pulmonary:     Effort: Pulmonary effort is normal.     Breath sounds: Normal breath sounds.     Comments: Lungs clear to auscultation Musculoskeletal:     Cervical back: Neck supple.  Skin:    General: Skin is warm.  Neurological:     Mental Status: She is alert and oriented to person, place, and time.  Psychiatric:        Mood and Affect: Mood normal.        Behavior: Behavior normal.        Thought Content: Thought content normal.         Judgment: Judgment normal.     Diagnostics: FVC 3.22 L (84%), FEV1 2.52 L (82%).  Predicted FVC 3.84 L, predicted FEV1 3.06 L.  Spirometry indicates normal ventilatory function.  Spirometry is improved from last visit.  Assessment and Plan: 1. Moderate persistent asthma without complication   2. Post-COVID chronic cough   3. Seasonal allergic rhinitis due to pollen   4. Gastroesophageal reflux disease, unspecified whether esophagitis present  5. History of nasal polypectomy   6. Recurrent infections     Meds ordered this encounter  Medications   Budeson-Glycopyrrol-Formoterol (BREZTRI AEROSPHERE) 160-9-4.8 MCG/ACT AERO    Sig: Inhale 2 puffs into the lungs in the morning and at bedtime. with spacer and rinse mouth afterwards.    Dispense:  32.1 g    Refill:  1   omeprazole (PRILOSEC) 40 MG capsule    Sig: Take 1 capsule (40 mg total) by mouth daily.    Dispense:  90 capsule    Refill:  1    Patient Instructions  Post-COVID chronic cough Past history - Diagnosed with COVID-19 in January 2021 and developed a persistent cough afterwards.  Evaluated by pulmonology and diagnosed with asthma. Up-to-date with COVID-19 vaccination. Covid-19 in July 2022 again. CT chest showed nodular densities.  5 courses of prednisone in 2022. Bloodwork negative to alpha-1 antitrypsin, ANA, and ANCA.  Get CT Chest as ordered previously. We will try to get this re-approved for [facility] in [place] Follow up with pulmonology as well. Discussed 2nd opinion as recommended by Dr. Selena Batten. She will see who is in network    Asthma Past history - Diagnosed with COVID-19 in January 2021 and developed a persistent cough afterwards.  Evaluated by pulmonology and diagnosed with asthma. Up-to-date with COVID-19 vaccination. Covid-19 in July 2022. 5 courses of prednisone in 2022. CT chest showed nodular densities. Eos 100, Ige 116. Dislikes Trelegy.  Daily controller medication(s):  Increase Breztri 2 puffs twice a  day with spacer and rinse mouth afterwards. Reviewed proper technique. Samples given During upper respiratory infections/asthma flares:  Use albuterol 2 puffs or albuterol nebulizer twice a day for 1-2 weeks until symptoms subside.  If you need to use your albuterol nebulizer machine back to back within 15-30 minutes with no relief then please go to the ER/urgent care for further evaluation.  May use albuterol rescue inhaler 2 puffs or nebulizer every 4 to 6 hours as needed for shortness of breath, chest tightness, coughing, and wheezing. May use albuterol rescue inhaler 2 puffs 5 to 15 minutes prior to strenuous physical activities. Monitor frequency of use.    Seasonal allergic rhinitis due to pollen Past history - Rhinoconjunctivitis symptoms for many years mainly in the spring and fall but now worsening since May.  2021 bloodwork was negative. 2021 skin testing showed: Positive to grass pollen only. S/p polypectomy and deviated septum repair in 2022. 2023 bloodwork negative to environmental allergy panel.  Interim history - stable.  Continue environmental control measures as below. May use over the counter antihistamines such as Zyrtec (cetirizine), Claritin (loratadine), Allegra (fexofenadine), or Xyzal (levocetirizine) daily as needed. May take twice a day if needed. Stop Singulair (montelukast) due to no difference in symptoms since being off Use Flonase (fluticasone) nasal spray 1 spray per nostril twice a day as needed for nasal congestion. May also use Xhance in [employer] place.  Nasal saline spray (i.e., Simply Saline) or nasal saline lavage (i.e., NeilMed) is recommended as needed and prior to medicated nasal sprays.   Gastroesophageal reflux disease Past history - Globus sensation resolved with omeprazole. Continue lifestyle and dietary modifications.  Re-start omeprazole 40mg  daily in the morning. Nothing to eat or drink for 30 minutes. Stop Pepcid   History of nasal  polypectomy Past history - polypectomy in January 2022. Interim history - sense of smell returned.  Only use if needed: Xhance (fluticasone) nasal spray 1-2 sprays per nostril twice a day as needed  for nasal congestion.    Recurrent infections Past history - Normal immunoglobulin levels, low pneumococcal titers.   Keep track of infections..  Get repeat strep pneumoniae titers. We will call you with results once they are back  Schedule a follow up appointment in 6 weeks with Dr. Selena Batten or sooner if needed  Return in about 6 weeks (around 03/10/2023), or if symptoms worsen or fail to improve.    Thank you for the opportunity to care for this patient.  Please do not hesitate to contact me with questions.  Nehemiah Settle, FNP [facility]

## 2023-01-28 ENCOUNTER — Telehealth: Payer: Self-pay | Admitting: *Deleted

## 2023-01-28 NOTE — Telephone Encounter (Signed)
-----   Message from Nehemiah Settle, FNP sent at 01/27/2023 10:30 AM EDT ----- Please check for preauthorization of CT chest without contrast. She would like to have completed at  [facility] in [facility]. Phone number:[phone]

## 2023-01-28 NOTE — Telephone Encounter (Signed)
I called and spoke with patients insurance and initiated Prior Authorization for Chest CT without Contrast- CPT Code [address]. I provided the NPI for [facility] as [id number]. Upon answering questions for the diagnosis there were no options under the diagnosis that was associated with the CPT code so they stated that the provider will need to call and do a peer to peer review. You will need to call [phone] and provide reference number [id number] to do a peer to peer review for the Chest CT without contrast. If approved the requisition will need to be printed and signed you you, the Approval number will need to be written on the requisition, and then faxed to [facility] at [phone]. They have confirmed that upon receiving that they will call the patient to schedule the scan.

## 2023-01-29 DIAGNOSIS — R051 Acute cough: Secondary | ICD-10-CM | POA: Diagnosis not present

## 2023-01-29 DIAGNOSIS — R0981 Nasal congestion: Secondary | ICD-10-CM | POA: Diagnosis not present

## 2023-01-29 DIAGNOSIS — J45901 Unspecified asthma with (acute) exacerbation: Secondary | ICD-10-CM | POA: Diagnosis not present

## 2023-01-29 DIAGNOSIS — J038 Acute tonsillitis due to other specified organisms: Secondary | ICD-10-CM | POA: Diagnosis not present

## 2023-01-29 DIAGNOSIS — R070 Pain in throat: Secondary | ICD-10-CM | POA: Diagnosis not present

## 2023-01-29 DIAGNOSIS — R0982 Postnasal drip: Secondary | ICD-10-CM | POA: Diagnosis not present

## 2023-01-29 DIAGNOSIS — R509 Fever, unspecified: Secondary | ICD-10-CM | POA: Diagnosis not present

## 2023-02-02 NOTE — Telephone Encounter (Signed)
Faxed order with approval number. Pt has been informed to give them a call to schedule.

## 2023-02-02 NOTE — Telephone Encounter (Signed)
Spoke with Rolly Salter and Malana's CT chest without contrast has been approved. Authorization # :[id number]. This authorization is valid from 01/28/23- 02/26/23.   Please let the patient know that we finally got the CT approved.

## 2023-02-04 DIAGNOSIS — J4540 Moderate persistent asthma, uncomplicated: Secondary | ICD-10-CM | POA: Diagnosis not present

## 2023-02-04 DIAGNOSIS — J181 Lobar pneumonia, unspecified organism: Secondary | ICD-10-CM | POA: Diagnosis not present

## 2023-02-11 ENCOUNTER — Telehealth: Payer: Self-pay

## 2023-02-11 NOTE — Progress Notes (Signed)
Chest CT scan hard copy states that the patient has bronchiectasis.   Patient has had a productive cough for greater than 6 months.   Manual CPT was considered, however, due to patient's lung disease, they would not tolerate the percussion and positioning and therefore they are not a candidate for manual CPT.  Malachi Bonds, MD [facility]

## 2023-02-11 NOTE — Progress Notes (Signed)
Reviewed chest CT findings with Nehemiah Settle. They certainly seem consistent with Mycobacterium avium complex. We are going to get her in to see [facility]. We can certainly get set up with a chest physiotherapy in the interim.   Malachi Bonds, MD [facility]

## 2023-02-11 NOTE — Addendum Note (Signed)
Addended by: Alfonse Spruce on: 02/11/2023 08:32 AM   Modules accepted: Orders

## 2023-02-11 NOTE — Telephone Encounter (Signed)
Lm for pt to call us back about ct chest results  Per [facility] results are: I looked at results with supervising pysician Dr Dellis Anes, since Dr Selena Batten is out of the office. It looks like the CT shows chronic lung infection. Recommend that you get a 2nd opinion with [facility]. A referral has been sent already. We are also going to try to get you set up with a chest physiotherapy vest that Traci in our [place] office is working on getting ordered for you, the shaking vest which loosens all the mucus in your chest.

## 2023-02-14 NOTE — Progress Notes (Signed)
Thank you Dr. Gallagher. °

## 2023-02-15 ENCOUNTER — Telehealth: Payer: Self-pay

## 2023-02-15 NOTE — Progress Notes (Signed)
Patient is scheduled to see Dr Irma Newness on 03/04/2023 @ 2:00 PM

## 2023-02-15 NOTE — Telephone Encounter (Signed)
Lm for pt to call us back  

## 2023-02-15 NOTE — Telephone Encounter (Signed)
Dr. Dellis Anes has made an addendum to his note. Paperwork given to Dyasia and she faxed it back to Sarah. Hopefully this is all the information she needs to move forward with the vest.

## 2023-02-15 NOTE — Telephone Encounter (Signed)
Sarah from [employer] stopped by with a note stating she needed the patients insurance information and that the patient has a productive cough for over 6 months. This information has been given to Trayce.   I have faxed the paperwork as requested by Trayce.

## 2023-02-16 NOTE — Telephone Encounter (Signed)
Pt informed of results and she says the vest therapy people called today and she will give them a call back. She will wait to hear from [facility].

## 2023-02-24 NOTE — Telephone Encounter (Signed)
Called and left a voicemail asking for patient to return call to discuss.  °

## 2023-02-24 NOTE — Telephone Encounter (Signed)
Sarah from [employer] called to see if someone could call the patient to discuss the vest with her as she told sarah our office hadn't informed her about the vest. I see the message below where the patient opened it, but could someone call the patient and update sarah once the patient is informed.   Phone: [phone] Maralyn Sago

## 2023-02-25 NOTE — Telephone Encounter (Signed)
Called and left a voicemail asking for patient to return call to discuss.  °

## 2023-03-02 NOTE — Telephone Encounter (Signed)
Lindsay Mays with [employer] stopped by office & left note   "I wanted to follow up with you on Dr. Ellouise Newer patient. We've reached her husband successfully & he shared patient's email w/ me as the best way to reach her. We're passing along some info about the best & expectations of therapy in order for her to make a decision about moving forward. I will let you know in the coming days how she wishes to move forward. Please reach out to me w/ questions!  Thanks for your help! Lindsay Mays [phone]"

## 2023-03-04 ENCOUNTER — Institutional Professional Consult (permissible substitution) (HOSPITAL_BASED_OUTPATIENT_CLINIC_OR_DEPARTMENT_OTHER): Payer: BC Managed Care – PPO | Admitting: Pulmonary Disease

## 2023-03-04 NOTE — Telephone Encounter (Signed)
Thanks everyone!   Alexandra Lipps, MD [facility]  

## 2023-03-08 ENCOUNTER — Encounter (HOSPITAL_BASED_OUTPATIENT_CLINIC_OR_DEPARTMENT_OTHER): Payer: Self-pay | Admitting: Pulmonary Disease

## 2023-03-15 NOTE — Telephone Encounter (Signed)
Sarah from [facility] came into the office today with an update on the patient. Patient has declined the vest because she feels like she does not need it. Advised rep Nicholes Calamity to email the provider at [facility] email address. The company will not be moving forward with vest therapy for this patient at this time.

## 2023-03-16 NOTE — Telephone Encounter (Signed)
Sounds good - thank you for the update!   Malachi Bonds, MD [facility]

## 2023-03-17 ENCOUNTER — Encounter: Payer: Self-pay | Admitting: Allergy

## 2023-03-17 ENCOUNTER — Ambulatory Visit (INDEPENDENT_AMBULATORY_CARE_PROVIDER_SITE_OTHER): Payer: BC Managed Care – PPO | Admitting: Allergy

## 2023-03-17 ENCOUNTER — Other Ambulatory Visit: Payer: Self-pay

## 2023-03-17 VITALS — BP 102/74 | HR 94 | Temp 97.6°F | Resp 14

## 2023-03-17 DIAGNOSIS — J4540 Moderate persistent asthma, uncomplicated: Secondary | ICD-10-CM | POA: Diagnosis not present

## 2023-03-17 DIAGNOSIS — K219 Gastro-esophageal reflux disease without esophagitis: Secondary | ICD-10-CM

## 2023-03-17 DIAGNOSIS — B999 Unspecified infectious disease: Secondary | ICD-10-CM

## 2023-03-17 DIAGNOSIS — J301 Allergic rhinitis due to pollen: Secondary | ICD-10-CM | POA: Diagnosis not present

## 2023-03-17 DIAGNOSIS — B370 Candidal stomatitis: Secondary | ICD-10-CM

## 2023-03-17 DIAGNOSIS — U099 Post covid-19 condition, unspecified: Secondary | ICD-10-CM

## 2023-03-17 DIAGNOSIS — R053 Chronic cough: Secondary | ICD-10-CM

## 2023-03-17 DIAGNOSIS — Z9889 Other specified postprocedural states: Secondary | ICD-10-CM

## 2023-03-17 DIAGNOSIS — Z8709 Personal history of other diseases of the respiratory system: Secondary | ICD-10-CM

## 2023-03-17 MED ORDER — NYSTATIN 100000 UNIT/ML MT SUSP: 5.0000 mL | Freq: Four times a day (QID) | OROMUCOSAL | 0 refills | Status: DC

## 2023-03-17 NOTE — Patient Instructions (Addendum)
Breathing:  Follow up with pulmonology  - there were some chronic changes on the CT chest.  Daily controller medication(s):  Breztri 2 puffs twice a day with spacer and rinse mouth afterwards.  During respiratory infections/asthma flares:  Use albuterol 2 puffs or albuterol nebulizer twice a day for 1-2 weeks until symptoms subside.  If you need to use your albuterol nebulizer machine back to back within 15-30 minutes with no relief then please go to the ER/urgent care for further evaluation.  May use albuterol rescue inhaler 2 puffs or nebulizer every 4 to 6 hours as needed for shortness of breath, chest tightness, coughing, and wheezing. May use albuterol rescue inhaler 2 puffs 5 to 15 minutes prior to strenuous physical activities. Monitor frequency of use.  Breathing control goals:  Full participation in all desired activities (may need albuterol before activity) Albuterol use two times or less a week on average (not counting use with activity) Cough interfering with sleep two times or less a month Oral steroids no more than once a year No hospitalizations   Environmental allergies 2021 skin testing positive to grass pollen.  Continue environmental control measures as below. May use over the counter antihistamines such as Zyrtec (cetirizine), Claritin (loratadine), Allegra (fexofenadine), or Xyzal (levocetirizine) daily as needed. May take twice a day if needed. Continue Singulair (montelukast) 10mg  daily at night. Use Flonase (fluticasone) nasal spray 1 spray per nostril twice a day as needed for nasal congestion.  Nasal saline spray (i.e., Simply Saline) or nasal saline lavage (i.e., NeilMed) is recommended as needed and prior to medicated nasal sprays.  History of polyps Only use if needed: Xhance (fluticasone) nasal spray 1-2 sprays per nostril twice a day as needed for nasal congestion.   Heartburn: Continue lifestyle and dietary modifications. Omeprazole 40mg  daily in the  morning. Nothing to eat or drink for 30 minutes.  Infections Keep track of infections. Get bloodwork.   Thrush Nystatin swish and swallow sent in. If no improvement, then will send in antifungal pills next. Recommend to use spacer with Breztri and use Listerine mouthwash afterwards.  Follow up in 2 months or sooner if needed.   Reducing Pollen Exposure Pollen seasons: trees (spring), grass (summer) and ragweed/weeds (fall). Keep windows closed in your home and car to lower pollen exposure.  Install air conditioning in the bedroom and throughout the house if possible.  Avoid going out in dry windy days - especially early morning. Pollen counts are highest between 5 - 10 AM and on dry, hot and windy days.  Save outside activities for late afternoon or after a heavy rain, when pollen levels are lower.  Avoid mowing of grass if you have grass pollen allergy. Be aware that pollen can also be transported indoors on people and pets.  Dry your clothes in an automatic dryer rather than hanging them outside where they might collect pollen.  Rinse hair and eyes before bedtime.

## 2023-03-17 NOTE — Progress Notes (Signed)
Follow Up Note  RE: Lindsay Mays MRN: [id number] DOB: 1976/06/07 Date of Office Visit: 03/17/2023  Referring provider: Natalia Leatherwood, DO Primary care provider: Natalia Leatherwood, DO  Chief Complaint: Follow-up  History of Present Illness: I had the pleasure of seeing Lindsay Mays for a follow up visit at the [facility] on 03/18/2023. She is a 47 y.o. female, who is being followed for chronic cough, asthma, recurrent infections, GERD, allergic rhinitis and history of nasal polypectomy. Her previous allergy office visit was on 01/27/2023 with Lindsay Settle FNP. Today is a regular follow up visit.  Post-COVID chronic cough/asthma Patient had bronchitis and was treated with antibiotics which helped. She states that her cough is much better than when I last saw her in November. She unfortunately did not follow up with me as requested 2 months after that visit.  She also didn't get the sputum culture that I ordered last year and she is not producing much sputum now.   Patient finally got CT chest in May 2024 which was ordered last year - it showed some chronic and acute changes. Patient states that she got the CT chest when she had the bronchitis and thinks it looked worse on the reading than what it should have. Discussed with patient that yes the acute changes may have been due to her bronchitis episode but there were some chronic changes on the CT chest that I would like pulmonology input on.   She was scheduled to see pulmonology in June but emr showed that she no showed. She does not recall missing her appointment. She thought she had one scheduled but she did not.  I strongly encouraged her to call pulmonology and gave her the number so she can set up the appointment.   Currently on Breztri 2 puffs BID and doing much better. She missed Breztri for a few weeks as she ran out of medication and noted a significant worsening of her respiratory status during those times.    Denies any ER/urgent care visits or prednisone use since the last visit. Walking a few miles per day with no issues.  No fevers/chills. No weight loss. Overall she is feeling much better.    Seasonal allergic rhinitis due to pollen Only takes daily OTC antihistamines.  Gastroesophageal reflux disease Takes omeprazole 40mg  daily in the morning with good benefit.  Globus sensation returns if she does not take this medication.    History of nasal polypectomy No issues.   Patient was in a rush to leave today as she had a meeting/phone call after the visit.  I did stress to her the importance of seeing pulmonology before our next visit.   02/04/2023 CT chest "1. Chronic mild mediastinal and left hilar adenopathy, not  appreciably changed from 07/01/2021 chest CT, favoring benign  reactive etiology.  2. Chronic patchy tree-in-bud opacities with associated mild  cylindrical bronchiectasis at the left greater than right lung  bases, similar at the left lung base and improved at the right lung  base since 07/01/2021 chest CT. New indistinct mild bandlike  consolidation with some associated volume loss in the posterior left  upper lobe. New mild patchy peripheral right upper lobe ground-glass  opacity. These findings are most suggestive of recurrent infectious  or inflammatory bronchiolitis such as due to recurrent aspiration  CT.  3. Aortic Atherosclerosis (ICD10-I70.0). "  Assessment and Plan: Lindsay Mays is a 47 y.o. female with: Post-COVID chronic cough Past history -  Diagnosed with COVID-19 in January 2021 and developed a persistent cough afterwards.  Evaluated by pulmonology and diagnosed with asthma. Up-to-date with COVID-19 vaccination. Covid-19 in July 2022 again. CT chest showed nodular densities.  5 courses of prednisone in 2022. Bloodwork negative to alpha-1 antitrypsin, ANA, and ANCA. Interim history - coughing resolved. Did not get sputum culture done as requested in Nov 2023  and now not producing anything like before. She finally got CT chest done in May 2024 which showed chronic (chronic patchy tree-in-bud opacities with associated mild cylindrical bronchiectasis and acute changes). She did not see pulmonology yet. Denies any fevers/chills, weight loss.  Follow up with pulmonology - phone number given.   Asthma Past history - Diagnosed with COVID-19 in January 2021 and developed a persistent cough afterwards.  Evaluated by pulmonology and diagnosed with asthma. Up-to-date with COVID-19 vaccination. Covid-19 in July 2022. 5 courses of prednisone in 2022. CT chest showed nodular densities. Eos 100, Ige 116. Dislikes Trelegy. Interim history - doing much better with Breztri. Daily controller medication(s):  Breztri 2 puffs twice a day with spacer and rinse mouth afterwards.  During respiratory infections/asthma flares:  Use albuterol 2 puffs or albuterol nebulizer twice a day for 1-2 weeks until symptoms subside.  If you need to use your albuterol nebulizer machine back to back within 15-30 minutes with no relief then please go to the ER/urgent care for further evaluation.  May use albuterol rescue inhaler 2 puffs or nebulizer every 4 to 6 hours as needed for shortness of breath, chest tightness, coughing, and wheezing. May use albuterol rescue inhaler 2 puffs 5 to 15 minutes prior to strenuous physical activities. Monitor frequency of use.   Seasonal allergic rhinitis due to pollen Past history - Rhinoconjunctivitis symptoms for many years mainly in the spring and fall but now worsening since May.  2021 bloodwork was negative. 2021 skin testing showed: Positive to grass pollen only. S/p polypectomy and deviated septum repair in 2022. 2023 bloodwork negative to environmental allergy panel.  Interim history - controlled with OTC antihistamines.  Continue environmental control measures as below. May use over the counter antihistamines such as Zyrtec (cetirizine), Claritin  (loratadine), Allegra (fexofenadine), or Xyzal (levocetirizine) daily as needed. May take twice a day if needed. Continue Singulair (montelukast) 10mg  daily at night. Use Flonase (fluticasone) nasal spray 1 spray per nostril twice a day as needed for nasal congestion.  Nasal saline spray (i.e., Simply Saline) or nasal saline lavage (i.e., NeilMed) is recommended as needed and prior to medicated nasal sprays.  History of nasal polypectomy Past history - polypectomy in January 2022. Interim history - sense of smell returned and no sinus issues.  Only use if needed: Xhance (fluticasone) nasal spray 1-2 sprays per nostril twice a day as needed for nasal congestion.   Gastroesophageal reflux disease Past history - Globus sensation resolved with omeprazole. Interim history - famotidine did not control symptoms. Much better with PPI. Continue lifestyle and dietary modifications. Omeprazole 40mg  daily in the morning. Nothing to eat or drink for 30 minutes.  Recurrent infections Past history - Normal immunoglobulin levels, low pneumococcal titers.  Interim history - did not get post pneumonia vaccine titers yet.  Keep track of infections. Get bloodwork.   Thrush, oral Nystatin swish and swallow sent in. If no improvement, then will send in antifungal pills next. Recommend to use spacer with Breztri and use Listerine mouthwash afterwards.  Return in about 2 months (around 05/18/2023).  Meds ordered this encounter  Medications  nystatin (MYCOSTATIN) 100000 UNIT/ML suspension    Sig: Take 5 mLs (500,000 Units total) by mouth 4 (four) times daily. Swish and swallow.    Dispense:  60 mL    Refill:  0   Lab Orders  No laboratory test(s) ordered today    Diagnostics: None.   Medication List:  Current Outpatient Medications  Medication Sig Dispense Refill   albuterol (PROVENTIL) (2.5 MG/3ML) 0.083% nebulizer solution Take 3 mLs (2.5 mg total) by nebulization every 4 (four) hours as  needed for wheezing or shortness of breath (coughing fits). 75 mL 2   albuterol (VENTOLIN HFA) 108 (90 Base) MCG/ACT inhaler Inhale 2 puffs into the lungs every 4 (four) hours as needed for wheezing or shortness of breath (coughing fits). 8 g 1   Budeson-Glycopyrrol-Formoterol (BREZTRI AEROSPHERE) 160-9-4.8 MCG/ACT AERO Inhale 2 puffs into the lungs in the morning and at bedtime. with spacer and rinse mouth afterwards. 32.1 g 1   Budeson-Glycopyrrol-Formoterol (BREZTRI AEROSPHERE) 160-9-4.8 MCG/ACT AERO Inhale 2 puffs into the lungs in the morning and at bedtime.     cetirizine (ZYRTEC) 10 MG tablet Take 10 mg by mouth daily as needed for allergies.     levonorgestrel (MIRENA) 20 MCG/24HR IUD by Intrauterine route.     montelukast (SINGULAIR) 10 MG tablet Take 1 tablet (10 mg total) by mouth at bedtime. 90 tablet 2   Multiple Vitamin (MULTIVITAMIN PO) Take 2 tablets by mouth daily at 6 (six) AM.     nystatin (MYCOSTATIN) 100000 UNIT/ML suspension Take 5 mLs (500,000 Units total) by mouth 4 (four) times daily. Swish and swallow. 60 mL 0   omeprazole (PRILOSEC) 40 MG capsule Take 1 capsule (40 mg total) by mouth daily. 90 capsule 1   No current facility-administered medications for this visit.   Allergies: No Known Allergies I reviewed her past medical history, social history, family history, and environmental history and no significant changes have been reported from her previous visit.  Review of Systems  Constitutional:  Negative for appetite change, chills, fever and unexpected weight change.  HENT:  Negative for congestion, rhinorrhea and sneezing.   Eyes:  Negative for itching.  Respiratory:  Negative for cough, chest tightness, shortness of breath and wheezing.   Cardiovascular:  Negative for chest pain.  Gastrointestinal:  Negative for abdominal pain.  Genitourinary:  Negative for difficulty urinating.  Skin:  Negative for rash.  Allergic/Immunologic: Positive for environmental  allergies.  Neurological:  Negative for headaches.    Objective: BP 102/74   Pulse 94   Temp 97.6 F (36.4 C)   Resp 14   SpO2 97%  There is no height or weight on file to calculate BMI. Physical Exam Vitals and nursing note reviewed.  Constitutional:      Appearance: Normal appearance. She is well-developed.  HENT:     Head: Normocephalic and atraumatic.     Right Ear: Tympanic membrane and external ear normal.     Left Ear: Tympanic membrane and external ear normal.     Nose: Nose normal.     Mouth/Throat:     Comments: White patches on pharynx. Eyes:     Conjunctiva/sclera: Conjunctivae normal.  Cardiovascular:     Rate and Rhythm: Normal rate and regular rhythm.     Heart sounds: Normal heart sounds. No murmur heard. Pulmonary:     Effort: Pulmonary effort is normal.     Breath sounds: No wheezing, rhonchi or rales.  Musculoskeletal:     Cervical back: Neck supple.  Skin:    General: Skin is warm.     Findings: No rash.  Neurological:     Mental Status: She is alert and oriented to person, place, and time.  Psychiatric:        Behavior: Behavior normal.    Previous notes and tests were reviewed. The plan was reviewed with the patient/family, and all questions/concerned were addressed.  It was my pleasure to see Lindsay Mays today and participate in her care. Please feel free to contact me with any questions or concerns.  Sincerely,  Wyline Mood, DO Allergy & Immunology  [facility] office: [phone] [facility] office: [phone]

## 2023-03-18 ENCOUNTER — Encounter: Payer: Self-pay | Admitting: Allergy

## 2023-03-18 DIAGNOSIS — B370 Candidal stomatitis: Secondary | ICD-10-CM | POA: Insufficient documentation

## 2023-03-18 HISTORY — DX: Candidal stomatitis: B37.0

## 2023-03-18 NOTE — Assessment & Plan Note (Signed)
Past history - Rhinoconjunctivitis symptoms for many years mainly in the spring and fall but now worsening since May.  2021 bloodwork was negative. 2021 skin testing showed: Positive to grass pollen only. S/p polypectomy and deviated septum repair in 2022. 2023 bloodwork negative to environmental allergy panel.  Interim history - controlled with OTC antihistamines.  Continue environmental control measures as below. May use over the counter antihistamines such as Zyrtec (cetirizine), Claritin (loratadine), Allegra (fexofenadine), or Xyzal (levocetirizine) daily as needed. May take twice a day if needed. Continue Singulair (montelukast) 10mg  daily at night. Use Flonase (fluticasone) nasal spray 1 spray per nostril twice a day as needed for nasal congestion.  Nasal saline spray (i.e., Simply Saline) or nasal saline lavage (i.e., NeilMed) is recommended as needed and prior to medicated nasal sprays.

## 2023-03-18 NOTE — Assessment & Plan Note (Addendum)
Past history - Diagnosed with COVID-19 in January 2021 and developed a persistent cough afterwards.  Evaluated by pulmonology and diagnosed with asthma. Up-to-date with COVID-19 vaccination. Covid-19 in July 2022 again. CT chest showed nodular densities.  5 courses of prednisone in 2022. Bloodwork negative to alpha-1 antitrypsin, ANA, and ANCA. Interim history - coughing resolved. Did not get sputum culture done as requested in Nov 2023 and now not producing anything like before. She finally got CT chest done in May 2024 which showed chronic (chronic patchy tree-in-bud opacities with associated mild cylindrical bronchiectasis and acute changes). She did not see pulmonology yet. Denies any fevers/chills, weight loss.  Follow up with pulmonology - phone number given.

## 2023-03-18 NOTE — Assessment & Plan Note (Signed)
Nystatin swish and swallow sent in. If no improvement, then will send in antifungal pills next. Recommend to use spacer with Breztri and use Listerine mouthwash afterwards.

## 2023-03-18 NOTE — Assessment & Plan Note (Signed)
Past history - Diagnosed with COVID-19 in January 2021 and developed a persistent cough afterwards.  Evaluated by pulmonology and diagnosed with asthma. Up-to-date with COVID-19 vaccination. Covid-19 in July 2022. 5 courses of prednisone in 2022. CT chest showed nodular densities. Eos 100, Ige 116. Dislikes Trelegy. Interim history - doing much better with Breztri. Daily controller medication(s):  Breztri 2 puffs twice a day with spacer and rinse mouth afterwards.  During respiratory infections/asthma flares:  Use albuterol 2 puffs or albuterol nebulizer twice a day for 1-2 weeks until symptoms subside.  If you need to use your albuterol nebulizer machine back to back within 15-30 minutes with no relief then please go to the ER/urgent care for further evaluation.  May use albuterol rescue inhaler 2 puffs or nebulizer every 4 to 6 hours as needed for shortness of breath, chest tightness, coughing, and wheezing. May use albuterol rescue inhaler 2 puffs 5 to 15 minutes prior to strenuous physical activities. Monitor frequency of use.

## 2023-03-18 NOTE — Assessment & Plan Note (Signed)
Past history - polypectomy in January 2022. Interim history - sense of smell returned and no sinus issues.  Only use if needed: Xhance (fluticasone) nasal spray 1-2 sprays per nostril twice a day as needed for nasal congestion.

## 2023-03-18 NOTE — Assessment & Plan Note (Signed)
Past history - Normal immunoglobulin levels, low pneumococcal titers.  Interim history - did not get post pneumonia vaccine titers yet.  Keep track of infections. Get bloodwork.

## 2023-03-18 NOTE — Assessment & Plan Note (Signed)
Past history - Globus sensation resolved with omeprazole. Interim history - famotidine did not control symptoms. Much better with PPI. Continue lifestyle and dietary modifications. Omeprazole 40mg  daily in the morning. Nothing to eat or drink for 30 minutes.

## 2023-03-30 DIAGNOSIS — H40003 Preglaucoma, unspecified, bilateral: Secondary | ICD-10-CM | POA: Diagnosis not present

## 2023-04-20 ENCOUNTER — Telehealth: Payer: Self-pay | Admitting: Allergy

## 2023-04-20 NOTE — Telephone Encounter (Signed)
Please place referral to pulmonology for chronic cough and CT chest changes.  CT chest done in May 2024 which showed chronic patchy tree-in-bud opacities with associated mild cylindrical bronchiectasis.   Please also call the patient and give her the phone number for the pulmonologist.

## 2023-04-28 NOTE — Telephone Encounter (Signed)
Patient was referred to [facility] [place] on 02/11/2023. Patient no showed her scheduled visit on 03/04/2023. Patient was sent a letter to contact them to get rescheduled from their office.  I called and left a detailed voicemail for the patient today with their office information. Their office agreed to scheduling her again. She has two more no shows before she will be discharged from the practice.   [facility] [address] [place],  [place]  [address] Main: [phone]

## 2023-05-11 ENCOUNTER — Encounter: Payer: BC Managed Care – PPO | Admitting: Family Medicine

## 2023-05-31 DIAGNOSIS — M25561 Pain in right knee: Secondary | ICD-10-CM

## 2023-05-31 HISTORY — DX: Pain in right knee: M25.561

## 2023-06-15 ENCOUNTER — Encounter: Payer: Self-pay | Admitting: Family Medicine

## 2023-06-15 ENCOUNTER — Ambulatory Visit (INDEPENDENT_AMBULATORY_CARE_PROVIDER_SITE_OTHER): Payer: BC Managed Care – PPO | Admitting: Family Medicine

## 2023-06-15 VITALS — BP 118/78 | HR 92 | Temp 98.0°F | Ht 66.0 in | Wt 148.6 lb

## 2023-06-15 DIAGNOSIS — Z23 Encounter for immunization: Secondary | ICD-10-CM | POA: Diagnosis not present

## 2023-06-15 DIAGNOSIS — Z1231 Encounter for screening mammogram for malignant neoplasm of breast: Secondary | ICD-10-CM | POA: Diagnosis not present

## 2023-06-15 DIAGNOSIS — Z13220 Encounter for screening for lipoid disorders: Secondary | ICD-10-CM

## 2023-06-15 DIAGNOSIS — J029 Acute pharyngitis, unspecified: Secondary | ICD-10-CM | POA: Diagnosis not present

## 2023-06-15 DIAGNOSIS — Z0000 Encounter for general adult medical examination without abnormal findings: Secondary | ICD-10-CM | POA: Diagnosis not present

## 2023-06-15 DIAGNOSIS — B370 Candidal stomatitis: Secondary | ICD-10-CM | POA: Diagnosis not present

## 2023-06-15 DIAGNOSIS — Z131 Encounter for screening for diabetes mellitus: Secondary | ICD-10-CM

## 2023-06-15 DIAGNOSIS — K649 Unspecified hemorrhoids: Secondary | ICD-10-CM | POA: Diagnosis not present

## 2023-06-15 HISTORY — DX: Unspecified hemorrhoids: K64.9

## 2023-06-15 LAB — LIPID PANEL
Cholesterol: 207 mg/dL — ABNORMAL HIGH (ref 0–200)
HDL: 56.3 mg/dL (ref >39.00)
LDL Cholesterol: 130 mg/dL — ABNORMAL HIGH (ref 0–99)
NonHDL: 150.29
Total CHOL/HDL Ratio: 4
Triglycerides: 102 mg/dL (ref 0.0–149.0)
VLDL: 20.4 mg/dL (ref 0.0–40.0)

## 2023-06-15 LAB — COMPREHENSIVE METABOLIC PANEL
ALT: 14 U/L (ref 0–35)
AST: 14 U/L (ref 0–37)
Albumin: 4.5 g/dL (ref 3.5–5.2)
Alkaline Phosphatase: 50 U/L (ref 39–117)
BUN: 12 mg/dL (ref 6–23)
CO2: 27 meq/L (ref 19–32)
Calcium: 9.5 mg/dL (ref 8.4–10.5)
Chloride: 106 meq/L (ref 96–112)
Creatinine, Ser: 0.77 mg/dL (ref 0.40–1.20)
GFR: 90.62 mL/min (ref >60.00)
Glucose, Bld: 84 mg/dL (ref 70–99)
Potassium: 4.2 meq/L (ref 3.5–5.1)
Sodium: 139 meq/L (ref 135–145)
Total Bilirubin: 0.6 mg/dL (ref 0.2–1.2)
Total Protein: 6.7 g/dL (ref 6.0–8.3)

## 2023-06-15 LAB — CBC
HCT: 46.4 % — ABNORMAL HIGH (ref 36.0–46.0)
Hemoglobin: 15.2 g/dL — ABNORMAL HIGH (ref 12.0–15.0)
MCHC: 32.9 g/dL (ref 30.0–36.0)
MCV: 90.6 fL (ref 78.0–100.0)
Platelets: 314 10*3/uL (ref 150.0–400.0)
RBC: 5.12 Mil/uL — ABNORMAL HIGH (ref 3.87–5.11)
RDW: 12.5 % (ref 11.5–15.5)
WBC: 12.2 10*3/uL — ABNORMAL HIGH (ref 4.0–10.5)

## 2023-06-15 LAB — HEMOGLOBIN A1C: Hgb A1c MFr Bld: 5.3 % (ref 4.6–6.5)

## 2023-06-15 LAB — POCT RAPID STREP A (OFFICE): Rapid Strep A Screen: NEGATIVE

## 2023-06-15 LAB — TSH: TSH: 1.95 u[IU]/mL (ref 0.35–5.50)

## 2023-06-15 MED ORDER — NYSTATIN 100000 UNIT/ML MT SUSP: 5.0000 mL | Freq: Four times a day (QID) | OROMUCOSAL | 0 refills | Status: DC

## 2023-06-15 MED ORDER — FLUCONAZOLE 150 MG PO TABS: ORAL_TABLET | ORAL | 0 refills | Status: DC

## 2023-06-15 NOTE — Patient Instructions (Addendum)
Return in about 1 year (around 06/15/2024) for cpe (20 min).        Great to see you today.  I have refilled the medication(s) we provide.   If labs were collected or images ordered, we will inform you of  results once we have received them and reviewed. We will contact you either by echart message, or telephone call.  Please give ample time to the testing facility, and our office to run,  receive and review results. Please do not call inquiring of results, even if you can see them in your chart. We will contact you as soon as we are able. If it has been over 1 week since the test was completed, and you have not yet heard from [place], then please call us.    - echart message- for normal results that have been seen by the patient already.   - telephone call: abnormal results or if patient has not viewed results in their echart.  If a referral to a specialist was entered for you, please call us in 2 weeks if you have not heard from the specialist office to schedule.

## 2023-06-15 NOTE — Progress Notes (Signed)
Patient ID: Lindsay Mays, female  DOB: Apr 08, 1976, 47 y.o.   MRN: [id number] Patient Care Team    Relationship Specialty Notifications Start End  Natalia Leatherwood, DO PCP - General Family Medicine  03/10/21   Marisue Brooklyn, MD Referring Physician   03/10/21   Stephens November, MD Referring Physician Pediatrics  03/10/21   Lynnea Ferrier., MD Referring Physician Obstetrics and Gynecology  03/10/21     Chief Complaint  Patient presents with   Annual Exam    Pt is fasting    Subjective:  Lindsay Mays is a 47 y.o.  Female  present for CPE All past medical history, surgical history, allergies, family history, immunizations, medications and social history were updated in the electronic medical record today. All recent labs, ED visits and hospitalizations within the last year were reviewed.  Health maintenance:  Colonoscopy: FHX colon cancer in father (30)- 07/2021 - 3 yr follow up- Dr. Barron Alvine Mammogram: 12/17/2022- normal. [facility] Cervical cancer screening: last pap: 2021- GYN Immunizations: tdap UTD 04/2021, Influenza administered today-(encouraged yearly), covid series completed.  Infectious disease screening: HIV completed w/ preg., Hep C completed  DEXA: per routine screen Patient has a Dental home. Hospitalizations/ED visits: reviewed      06/15/2023    7:52 AM 05/05/2022    8:05 AM 02/12/2022   10:31 AM 05/04/2021    2:42 PM 03/10/2021    1:12 PM  Depression screen PHQ 2/9  Decreased Interest 0 0 0 0 0  Down, Depressed, Hopeless 0 0 0 0 0  PHQ - 2 Score 0 0 0 0 0       No data to display          Immunization History  Administered Date(s) Administered   Influenza Split 09/07/2011, 12/03/2012   Influenza, Seasonal, Injecte, Preservative Fre 06/15/2023   Influenza,inj,Quad PF,6+ Mos 05/05/2022   Influenza-Unspecified 06/06/2020   Moderna Sars-Covid-2 Vaccination 11/24/2019, 12/25/2019, 08/11/2020   Tdap 05/04/2021    Past Medical History:   Diagnosis Date   Abnormal Papanicolaou smear of cervix    Asthma    on daily meds with rescue   Bronchitis    COVID-19 09/2020   GERD (gastroesophageal reflux disease)    on meds   H/O bilateral breast reduction surgery 08/23/2008   Formatting of this note might be different from the original. Overview:  1997 [place], [place] Formatting of this note might be different from the original. 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   Recurrent upper respiratory infection (URI)    Seasonal allergies    Thrush, oral 03/18/2023   No Known Allergies Past Surgical History:  Procedure Laterality Date   BREAST REDUCTION SURGERY  1996   CESAREAN SECTION  2007   WISDOM TOOTH EXTRACTION     Family History  Problem Relation Age of Onset   Early death Father    Colon cancer Father 34       secondary to colitis   Colitis Father    Allergic rhinitis Neg Hx    Angioedema Neg Hx    Asthma Neg Hx    Atopy Neg Hx    Eczema Neg Hx    Immunodeficiency Neg Hx    Urticaria Neg Hx    Esophageal cancer Neg Hx    Stomach cancer Neg Hx    Rectal cancer Neg Hx    Social History   Social History Narrative  Marital status/children/pets: Married.  2 children   Education/employment: Bachelor's degree.  Works as a Transport planner.   Safety:      -smoke alarm in the home:Yes     - wears seatbelt: Yes     - Feels safe in their relationships: Yes       Allergies as of 06/15/2023   No Known Allergies      Medication List        Accurate as of June 15, 2023  9:00 AM. If you have any questions, ask your nurse or doctor.          albuterol (2.5 MG/3ML) 0.083% nebulizer solution Commonly known as: PROVENTIL Take 3 mLs (2.5 mg total) by nebulization every 4 (four) hours as needed for wheezing or shortness of breath (coughing fits).   albuterol 108 (90 Base) MCG/ACT inhaler Commonly known as: VENTOLIN HFA Inhale 2 puffs into the lungs every 4  (four) hours as needed for wheezing or shortness of breath (coughing fits).   Breztri Aerosphere 160-9-4.8 MCG/ACT Aero Generic drug: Budeson-Glycopyrrol-Formoterol Inhale 2 puffs into the lungs in the morning and at bedtime. with spacer and rinse mouth afterwards. What changed: Another medication with the same name was removed. Continue taking this medication, and follow the directions you see here. Changed by: Felix Pacini   cetirizine 10 MG tablet Commonly known as: ZYRTEC Take 10 mg by mouth daily as needed for allergies.   fluconazole 150 MG tablet Commonly known as: DIFLUCAN 1 tab po today and 1 tab after completing nystatin swich Started by: Felix Pacini   levonorgestrel 20 MCG/24HR IUD Commonly known as: MIRENA by Intrauterine route.   montelukast 10 MG tablet Commonly known as: Singulair Take 1 tablet (10 mg total) by mouth at bedtime.   MULTIVITAMIN PO Take 2 tablets by mouth daily at 6 (six) AM.   nystatin 100000 UNIT/ML suspension Commonly known as: MYCOSTATIN Take 5 mLs (500,000 Units total) by mouth 4 (four) times daily. 5 ml swish for 30 seconds and then spit out, QID, do not swallow. What changed: additional instructions Changed by: Felix Pacini   omeprazole 40 MG capsule Commonly known as: PRILOSEC Take 1 capsule (40 mg total) by mouth daily.        All past medical history, surgical history, allergies, family history, immunizations andmedications were updated in the EMR today and reviewed under the history and medication portions of their EMR.     Recent Results (from the past 2160 hour(s))  POCT rapid strep A     Status: None   Collection Time: 06/15/23  8:59 AM  Result Value Ref Range   Rapid Strep A Screen Negative Negative     ROS 14 pt review of systems performed and negative (unless mentioned in an HPI)  Objective: BP 118/78   Pulse 92   Temp 98 F (36.7 C)   Ht 5\' 6"  (1.676 m)   Wt 148 lb 9.6 oz (67.4 kg)   SpO2 97%   BMI 23.98  kg/m  Physical Exam Vitals and nursing note reviewed.  Constitutional:      General: She is not in acute distress.    Appearance: Normal appearance. She is not ill-appearing or toxic-appearing.  HENT:     Head: Normocephalic and atraumatic.     Right Ear: Tympanic membrane, ear canal and external ear normal. There is no impacted cerumen.     Left Ear: Ear canal and external ear normal. There is no impacted cerumen.  Ears:     Comments: Moderate Effusion left TM    Nose: No congestion or rhinorrhea.     Mouth/Throat:     Mouth: Mucous membranes are moist.     Pharynx: Oropharyngeal exudate and posterior oropharyngeal erythema present.  Eyes:     General: No scleral icterus.       Right eye: No discharge.        Left eye: No discharge.     Extraocular Movements: Extraocular movements intact.     Conjunctiva/sclera: Conjunctivae normal.     Pupils: Pupils are equal, round, and reactive to light.  Cardiovascular:     Rate and Rhythm: Normal rate and regular rhythm.     Pulses: Normal pulses.     Heart sounds: Normal heart sounds. No murmur heard.    No friction rub. No gallop.  Pulmonary:     Effort: Pulmonary effort is normal. No respiratory distress.     Breath sounds: Normal breath sounds. No stridor. No wheezing, rhonchi or rales.  Chest:     Chest wall: No tenderness.  Abdominal:     General: Abdomen is flat. Bowel sounds are normal. There is no distension.     Palpations: Abdomen is soft. There is no mass.     Tenderness: There is no abdominal tenderness. There is no right CVA tenderness, left CVA tenderness, guarding or rebound.     Hernia: No hernia is present.  Musculoskeletal:        General: No swelling, tenderness or deformity. Normal range of motion.     Cervical back: Normal range of motion and neck supple. No rigidity or tenderness.     Right lower leg: No edema.     Left lower leg: No edema.  Lymphadenopathy:     Cervical: No cervical adenopathy.  Skin:     General: Skin is warm and dry.     Coloration: Skin is not jaundiced or pale.     Findings: No bruising, erythema, lesion or rash.  Neurological:     General: No focal deficit present.     Mental Status: She is alert and oriented to person, place, and time. Mental status is at baseline.     Cranial Nerves: No cranial nerve deficit.     Sensory: No sensory deficit.     Motor: No weakness.     Coordination: Coordination normal.     Gait: Gait normal.     Deep Tendon Reflexes: Reflexes normal.  Psychiatric:        Mood and Affect: Mood normal.        Behavior: Behavior normal.        Thought Content: Thought content normal.        Judgment: Judgment normal.     No results found.  Assessment/plan: Lindsay Mays is a 47 y.o. female present for CPE  Routine general medical examination at a health care facility Patient was encouraged to exercise greater than 150 minutes a week. Patient was encouraged to choose a diet filled with fresh fruits and vegetables, and lean meats. AVS provided to patient today for education/recommendation on gender specific health and safety maintenance. Colonoscopy: FHX colon cancer in father (30)- 07/2021 - 3 yr follow up- Dr. Barron Alvine Mammogram: 12/17/2022- normal. [facility] ordered will request records Cervical cancer screening: last pap: 2021- GYN Immunizations: tdap UTD 04/2021, Influenza administered today-(encouraged yearly), covid series completed.  Infectious disease screenin - Comprehensive metabolic panel - TSH - CBC  Influenza vaccine needed - Flu  vaccine trivalent PF, 6mos and older(Flulaval,Afluria,Fluarix,Fluzone) Lipid screening - Lipid panel Diabetes mellitus screening - Hemoglobin A1c Pharyngitis, unspecified etiology - POCT rapid strep A> negative> thrush likely Hemorrhoids, unspecified hemorrhoid type - Ambulatory referral to General Surgery - pt would like to have hemorrhoid removed.  Thrush Nystatin swish Diflucan pre and post  nystatin Rinse very well after each inhaler use  Return in about 1 year (around 06/15/2024) for cpe (20 min).  Orders Placed This Encounter  Procedures   Flu vaccine trivalent PF, 6mos and older(Flulaval,Afluria,Fluarix,Fluzone)   Comprehensive metabolic panel   Hemoglobin A1c   TSH   Lipid panel   CBC   Ambulatory referral to General Surgery   POCT rapid strep A    Meds ordered this encounter  Medications   fluconazole (DIFLUCAN) 150 MG tablet    Sig: 1 tab po today and 1 tab after completing nystatin swich    Dispense:  2 tablet    Refill:  0   nystatin (MYCOSTATIN) 100000 UNIT/ML suspension    Sig: Take 5 mLs (500,000 Units total) by mouth 4 (four) times daily. 5 ml swish for 30 seconds and then spit out, QID, do not swallow.    Dispense:  100 mL    Refill:  0   Referral Orders         Ambulatory referral to General Surgery      Electronically signed by: Felix Pacini, DO [facility] [place]

## 2023-06-20 ENCOUNTER — Other Ambulatory Visit: Payer: Self-pay

## 2023-06-20 DIAGNOSIS — Z0000 Encounter for general adult medical examination without abnormal findings: Secondary | ICD-10-CM

## 2023-07-02 DIAGNOSIS — J019 Acute sinusitis, unspecified: Secondary | ICD-10-CM | POA: Diagnosis not present

## 2023-07-06 DIAGNOSIS — M25562 Pain in left knee: Secondary | ICD-10-CM | POA: Diagnosis not present

## 2023-07-06 DIAGNOSIS — M25561 Pain in right knee: Secondary | ICD-10-CM | POA: Diagnosis not present

## 2023-07-27 ENCOUNTER — Ambulatory Visit: Payer: Self-pay | Admitting: Surgery

## 2023-07-27 DIAGNOSIS — L290 Pruritus ani: Secondary | ICD-10-CM | POA: Diagnosis not present

## 2023-07-27 DIAGNOSIS — K641 Second degree hemorrhoids: Secondary | ICD-10-CM | POA: Insufficient documentation

## 2023-07-27 DIAGNOSIS — Z860101 Personal history of adenomatous and serrated colon polyps: Secondary | ICD-10-CM | POA: Diagnosis not present

## 2023-07-27 DIAGNOSIS — K644 Residual hemorrhoidal skin tags: Secondary | ICD-10-CM | POA: Diagnosis not present

## 2023-07-29 ENCOUNTER — Other Ambulatory Visit: Payer: Self-pay | Admitting: Family

## 2023-08-02 ENCOUNTER — Encounter: Payer: Self-pay | Admitting: Pulmonary Disease

## 2023-08-02 ENCOUNTER — Ambulatory Visit (INDEPENDENT_AMBULATORY_CARE_PROVIDER_SITE_OTHER): Payer: BC Managed Care – PPO | Admitting: Pulmonary Disease

## 2023-08-02 VITALS — BP 101/69 | HR 75 | Temp 97.4°F | Ht 66.0 in | Wt 151.0 lb

## 2023-08-02 DIAGNOSIS — R9389 Abnormal findings on diagnostic imaging of other specified body structures: Secondary | ICD-10-CM | POA: Diagnosis not present

## 2023-08-02 DIAGNOSIS — J4540 Moderate persistent asthma, uncomplicated: Secondary | ICD-10-CM | POA: Diagnosis not present

## 2023-08-02 MED ORDER — BREZTRI AEROSPHERE 160-9-4.8 MCG/ACT IN AERO: 2.0000 | INHALATION_SPRAY | Freq: Two times a day (BID) | RESPIRATORY_TRACT | 1 refills | Status: DC

## 2023-08-02 NOTE — Progress Notes (Unsigned)
@Patient  ID: Lindsay Mays, female    DOB: June 27, 1976, 47 y.o.   MRN: [id number]  Chief Complaint  Patient presents with   Consult    mycobacterium    Referring provider: Natalia Leatherwood, DO  HPI:   PMH:  Smoker/ Smoking History:  Maintenance:   Pt of:   08/02/2023  - Visit     Questionaires / Pulmonary Flowsheets:   ACT:  Asthma Control Test ACT Total Score  02/05/2021  8:00 AM 13  10/30/2020 11:00 AM 16    MMRC:     No data to display          Epworth:      No data to display          Tests:   FENO:  No results found for: "NITRICOXIDE"  PFT:     No data to display          WALK:      No data to display          Imaging: No results found.  Lab Results:  CBC    Component Value Date/Time   WBC 12.2 (H) 06/15/2023 0752   RBC 5.12 (H) 06/15/2023 0752   HGB 15.2 (H) 06/15/2023 0752   HGB 14.5 10/26/2021 0914   HCT 46.4 (H) 06/15/2023 0752   HCT 42.4 10/26/2021 0914   PLT 314.0 06/15/2023 0752   PLT 408 10/26/2021 0914   MCV 90.6 06/15/2023 0752   MCV 87 10/26/2021 0914   MCH 29.9 10/26/2021 0914   MCH 30.3 05/04/2021 1503   MCHC 32.9 06/15/2023 0752   RDW 12.5 06/15/2023 0752   RDW 12.1 10/26/2021 0914   LYMPHSABS 2.5 10/26/2021 0914   EOSABS 0.1 10/26/2021 0914   BASOSABS 0.1 10/26/2021 0914    BMET    Component Value Date/Time   NA 139 06/15/2023 0752   K 4.2 06/15/2023 0752   CL 106 06/15/2023 0752   CO2 27 06/15/2023 0752   GLUCOSE 84 06/15/2023 0752   BUN 12 06/15/2023 0752   CREATININE 0.77 06/15/2023 0752   CREATININE 0.70 05/04/2021 1503   CALCIUM 9.5 06/15/2023 0752    BNP No results found for: "BNP"  ProBNP No results found for: "PROBNP"  Specialty Problems       Pulmonary Problems   Chronic pansinusitis   Asthma   Seasonal allergic rhinitis due to pollen    No Known Allergies  Immunization History  Administered Date(s) Administered   Influenza Split 09/07/2011, 12/03/2012    Influenza, Seasonal, Injecte, Preservative Fre 06/15/2023   Influenza,inj,Quad PF,6+ Mos 05/05/2022   Influenza-Unspecified 06/06/2020   Moderna Sars-Covid-2 Vaccination 11/24/2019, 12/25/2019, 08/11/2020   Tdap 05/04/2021    Past Medical History:  Diagnosis Date   Abnormal Papanicolaou smear of cervix    Asthma    on daily meds with rescue   Bronchitis    COVID-19 09/2020   GERD (gastroesophageal reflux disease)    on meds   H/O bilateral breast reduction surgery 08/23/2008   Formatting of this note might be different from the original. Overview:  1997 [place], [place] Formatting of this note might be different from the original. 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   Recurrent upper respiratory infection (URI)    Seasonal allergies    Thrush, oral 03/18/2023    Tobacco History: Social History   Tobacco Use  Smoking Status Former   Types: Cigarettes  Smokeless Tobacco Never  Tobacco Comments   9/4 quit 10 years ago   Counseling given: Not Answered Tobacco comments: 9/4 quit 10 years ago   Continue to not smoke  Outpatient Encounter Medications as of 08/02/2023  Medication Sig   albuterol (PROVENTIL) (2.5 MG/3ML) 0.083% nebulizer solution Take 3 mLs (2.5 mg total) by nebulization every 4 (four) hours as needed for wheezing or shortness of breath (coughing fits).   albuterol (VENTOLIN HFA) 108 (90 Base) MCG/ACT inhaler Inhale 2 puffs into the lungs every 4 (four) hours as needed for wheezing or shortness of breath (coughing fits).   cholecalciferol (VITAMIN D3) 25 MCG (1000 UNIT) tablet Take 1,000 Units by mouth daily.   cyanocobalamin (VITAMIN B12) 1000 MCG tablet Take by mouth.   levonorgestrel (MIRENA) 20 MCG/24HR IUD by Intrauterine route.   omeprazole (PRILOSEC) 40 MG capsule TAKE 1 CAPSULE (40 MG TOTAL) BY MOUTH DAILY.   pyridOXINE (VITAMIN B6) 25 MG tablet Take by mouth.   [DISCONTINUED]  Budeson-Glycopyrrol-Formoterol (BREZTRI AEROSPHERE) 160-9-4.8 MCG/ACT AERO Inhale 2 puffs into the lungs in the morning and at bedtime. with spacer and rinse mouth afterwards.   Budeson-Glycopyrrol-Formoterol (BREZTRI AEROSPHERE) 160-9-4.8 MCG/ACT AERO Inhale 2 puffs into the lungs in the morning and at bedtime. with spacer and rinse mouth afterwards.   cetirizine (ZYRTEC) 10 MG tablet Take 10 mg by mouth daily as needed for allergies. (Patient not taking: Reported on 08/02/2023)   fluconazole (DIFLUCAN) 150 MG tablet 1 tab po today and 1 tab after completing nystatin swich (Patient not taking: Reported on 08/02/2023)   montelukast (SINGULAIR) 10 MG tablet Take 1 tablet (10 mg total) by mouth at bedtime. (Patient not taking: Reported on 06/15/2023)   Multiple Vitamin (MULTIVITAMIN PO) Take 2 tablets by mouth daily at 6 (six) AM. (Patient not taking: Reported on 08/02/2023)   nystatin (MYCOSTATIN) 100000 UNIT/ML suspension Take 5 mLs (500,000 Units total) by mouth 4 (four) times daily. 5 ml swish for 30 seconds and then spit out, QID, do not swallow. (Patient not taking: Reported on 08/02/2023)   No facility-administered encounter medications on file as of 08/02/2023.     Review of Systems  Review of Systems   Physical Exam  BP 101/69   Pulse 75   Temp (!) 97.4 F (36.3 C) (Oral)   Ht 5\' 6"  (1.676 m)   Wt 151 lb (68.5 kg)   SpO2 96%   BMI 24.37 kg/m   Wt Readings from Last 5 Encounters:  08/02/23 151 lb (68.5 kg)  06/15/23 148 lb 9.6 oz (67.4 kg)  01/27/23 146 lb 12.8 oz (66.6 kg)  06/29/22 152 lb 8 oz (69.2 kg)  05/05/22 148 lb (67.1 kg)    BMI Readings from Last 5 Encounters:  08/02/23 24.37 kg/m  06/15/23 23.98 kg/m  01/27/23 23.69 kg/m  06/29/22 24.61 kg/m  05/05/22 24.25 kg/m     Physical Exam General: Sitting in chair   Assessment & Plan:   Abnormal CT scan: Scattered tree-in-bud opacities in bilateral lower lobes 06/2021 with marked improvement near  resolution of the right improvement on the left 01/2023.  Linear area of atelectasis versus scarring in the lingula new.  She was being treated with bronchitis at the time.  Possible mucus impaction versus scarring from prior lung insult.  She reports multiple pneumonias and bronchitis in the interim.  She is asymptomatic.  Doing well.  No further imaging required.  Severe persistent asthma: Symptoms or decompensation marked by chronic cough.  Markedly improved on Breztri therapy.  Exacerbators  include soda water.  Advised to continue current therapy and follow-up with allergy and immunology.  Refill was provided a day as she is nearly out.  Discussed consideration of de-escalation in the future.   Return if symptoms worsen or fail to improve.   Karren Burly, MD 08/02/2023   This appointment required 62 minutes of patient care (this includes precharting, chart review, review of results, face-to-face care, etc.).

## 2023-08-24 DIAGNOSIS — H40003 Preglaucoma, unspecified, bilateral: Secondary | ICD-10-CM | POA: Diagnosis not present

## 2023-11-03 ENCOUNTER — Other Ambulatory Visit: Payer: Self-pay | Admitting: Surgery

## 2023-11-03 DIAGNOSIS — K641 Second degree hemorrhoids: Secondary | ICD-10-CM | POA: Diagnosis not present

## 2023-11-03 DIAGNOSIS — K649 Unspecified hemorrhoids: Secondary | ICD-10-CM | POA: Diagnosis not present

## 2023-11-03 DIAGNOSIS — K644 Residual hemorrhoidal skin tags: Secondary | ICD-10-CM | POA: Diagnosis not present

## 2023-11-03 DIAGNOSIS — K642 Third degree hemorrhoids: Secondary | ICD-10-CM | POA: Diagnosis not present

## 2023-11-07 LAB — SURGICAL PATHOLOGY

## 2023-12-14 ENCOUNTER — Telehealth: Payer: Self-pay | Admitting: Allergy

## 2023-12-14 NOTE — Telephone Encounter (Signed)
 Patient called stating she is needing a 3 month supply of Breztri sent to [facility] in [place].

## 2023-12-15 NOTE — Telephone Encounter (Signed)
 Patient is over due for an OV and will need one scheduled for a courtesy refill to be sent in.

## 2023-12-21 NOTE — Telephone Encounter (Signed)
 Patient has been scheduled for an appointment with Dr. Burdette Carolin on 01/19/24 at 3:00pm.

## 2024-01-04 DIAGNOSIS — Z1231 Encounter for screening mammogram for malignant neoplasm of breast: Secondary | ICD-10-CM | POA: Diagnosis not present

## 2024-01-04 LAB — HM MAMMOGRAPHY

## 2024-01-19 ENCOUNTER — Ambulatory Visit (INDEPENDENT_AMBULATORY_CARE_PROVIDER_SITE_OTHER): Admitting: Allergy

## 2024-01-19 ENCOUNTER — Encounter: Payer: Self-pay | Admitting: Allergy

## 2024-01-19 VITALS — BP 100/72 | HR 66 | Temp 98.1°F | Ht 65.35 in | Wt 151.2 lb

## 2024-01-19 DIAGNOSIS — B999 Unspecified infectious disease: Secondary | ICD-10-CM

## 2024-01-19 DIAGNOSIS — J301 Allergic rhinitis due to pollen: Secondary | ICD-10-CM

## 2024-01-19 DIAGNOSIS — K219 Gastro-esophageal reflux disease without esophagitis: Secondary | ICD-10-CM

## 2024-01-19 DIAGNOSIS — J4540 Moderate persistent asthma, uncomplicated: Secondary | ICD-10-CM | POA: Diagnosis not present

## 2024-01-19 DIAGNOSIS — Z8709 Personal history of other diseases of the respiratory system: Secondary | ICD-10-CM

## 2024-01-19 DIAGNOSIS — B370 Candidal stomatitis: Secondary | ICD-10-CM

## 2024-01-19 DIAGNOSIS — Z9889 Other specified postprocedural states: Secondary | ICD-10-CM

## 2024-01-19 MED ORDER — FLUCONAZOLE 100 MG PO TABS: ORAL_TABLET | ORAL | 0 refills | Status: AC

## 2024-01-19 MED ORDER — BREZTRI AEROSPHERE 160-9-4.8 MCG/ACT IN AERO: 2.0000 | INHALATION_SPRAY | Freq: Two times a day (BID) | RESPIRATORY_TRACT | 3 refills | Status: DC

## 2024-01-19 NOTE — Progress Notes (Unsigned)
 Follow Up Note  RE: Lindsay Mays MRN: [id number] DOB: March 29, 1976 Date of Office Visit: 01/19/2024  Referring provider: Mariel Shope, DO Primary care provider: Mariel Shope, DO  Chief Complaint: No chief complaint on file.  History of Present Illness: I had the pleasure of seeing Lindsay Mays for a follow up visit at the [facility] on 01/19/2024. She is a 48 y.o. female, who is being followed for asthma, allergic rhinitis, history of nasal polyps, GERD, recurrent infections. Her previous allergy office visit was on 03/17/2023 with Dr. Burdette Carolin. Today is a regular follow up visit.  Failed to follow-up as recommended.  Discussed the use of AI scribe software for clinical note transcription with the patient, who gave verbal consent to proceed.  History of Present Illness            08/02/2023 pulm visit: "Abnormal CT scan: Scattered tree-in-bud opacities in bilateral lower lobes 06/2021 with marked improvement near resolution of the right improvement on the left 01/2023.  Linear area of atelectasis versus scarring in the lingula new.  She was being treated with bronchitis at the time.  Possible mucus impaction versus scarring from prior lung insult.  She reports multiple pneumonias and bronchitis in the interim.  She is asymptomatic.  Doing well.  No further imaging required.   Severe persistent asthma: Symptoms or decompensation marked by chronic cough.  Markedly improved on Breztri  therapy.  Exacerbators include soda water.  Advised to continue current therapy and follow-up with allergy and immunology.  Refill was provided a day as she is nearly out.  Discussed consideration of de-escalation in the future."  Assessment and Plan: Lindsay Mays is a 48 y.o. female with: Post-COVID chronic cough Past history - Diagnosed with COVID-19 in January 2021 and developed a persistent cough afterwards.  Evaluated by pulmonology and diagnosed with asthma. Up-to-date with COVID-19  vaccination. Covid-19 in July 2022 again. CT chest showed nodular densities.  5 courses of prednisone  in 2022. Bloodwork negative to alpha-1 antitrypsin, ANA, and ANCA. Interim history - coughing resolved. Did not get sputum culture done as requested in Nov 2023 and now not producing anything like before. She finally got CT chest done in May 2024 which showed chronic (chronic patchy tree-in-bud opacities with associated mild cylindrical bronchiectasis and acute changes). She did not see pulmonology yet. Denies any fevers/chills, weight loss.  Follow up with pulmonology - phone number given.    Asthma Past history - Diagnosed with COVID-19 in January 2021 and developed a persistent cough afterwards.  Evaluated by pulmonology and diagnosed with asthma. Up-to-date with COVID-19 vaccination. Covid-19 in July 2022. 5 courses of prednisone  in 2022. CT chest showed nodular densities. Eos 100, Ige 116. Dislikes Trelegy. Interim history - doing much better with Breztri . Daily controller medication(s):  Breztri  2 puffs twice a day with spacer and rinse mouth afterwards.  During respiratory infections/asthma flares:  Use albuterol  2 puffs or albuterol  nebulizer twice a day for 1-2 weeks until symptoms subside.  If you need to use your albuterol  nebulizer machine back to back within 15-30 minutes with no relief then please go to the ER/urgent care for further evaluation.  May use albuterol  rescue inhaler 2 puffs or nebulizer every 4 to 6 hours as needed for shortness of breath, chest tightness, coughing, and wheezing. May use albuterol  rescue inhaler 2 puffs 5 to 15 minutes prior to strenuous physical activities. Monitor frequency of use.    Seasonal allergic rhinitis due to pollen  Past history - Rhinoconjunctivitis symptoms for many years mainly in the spring and fall but now worsening since May.  2021 bloodwork was negative. 2021 skin testing showed: Positive to grass pollen only. S/p polypectomy and deviated  septum repair in 2022. 2023 bloodwork negative to environmental allergy panel.  Interim history - controlled with OTC antihistamines.  Continue environmental control measures as below. May use over the counter antihistamines such as Zyrtec (cetirizine), Claritin (loratadine), Allegra (fexofenadine), or Xyzal (levocetirizine) daily as needed. May take twice a day if needed. Continue Singulair  (montelukast ) 10mg  daily at night. Use Flonase (fluticasone ) nasal spray 1 spray per nostril twice a day as needed for nasal congestion.  Nasal saline spray (i.e., Simply Saline) or nasal saline lavage (i.e., NeilMed) is recommended as needed and prior to medicated nasal sprays.   History of nasal polypectomy Past history - polypectomy in January 2022. Interim history - sense of smell returned and no sinus issues.  Only use if needed: Xhance  (fluticasone ) nasal spray 1-2 sprays per nostril twice a day as needed for nasal congestion.    Gastroesophageal reflux disease Past history - Globus sensation resolved with omeprazole . Interim history - famotidine did not control symptoms. Much better with PPI. Continue lifestyle and dietary modifications. Omeprazole  40mg  daily in the morning. Nothing to eat or drink for 30 minutes.   Recurrent infections Past history - Normal immunoglobulin levels, low pneumococcal titers.  Interim history - did not get post pneumonia vaccine titers yet.  Keep track of infections. Get bloodwork.    Thrush, oral Nystatin  swish and swallow sent in. If no improvement, then will send in antifungal pills next. Recommend to use spacer with Breztri  and use Listerine mouthwash afterwards. Assessment and Plan              No follow-ups on file.  No orders of the defined types were placed in this encounter.  Lab Orders  No laboratory test(s) ordered today    Diagnostics: Spirometry:  Tracings reviewed. Her effort: Good reproducible efforts. FVC: 3.36L FEV1: 2.64L, 92%  predicted FEV1/FVC ratio: 79% Interpretation: Spirometry consistent with normal pattern.  Please see scanned spirometry results for details.  Results discussed with patient/family.   Medication List:  Current Outpatient Medications  Medication Sig Dispense Refill   albuterol  (PROVENTIL ) (2.5 MG/3ML) 0.083% nebulizer solution Take 3 mLs (2.5 mg total) by nebulization every 4 (four) hours as needed for wheezing or shortness of breath (coughing fits). 75 mL 2   albuterol  (VENTOLIN  HFA) 108 (90 Base) MCG/ACT inhaler Inhale 2 puffs into the lungs every 4 (four) hours as needed for wheezing or shortness of breath (coughing fits). 8 g 1   Budeson-Glycopyrrol-Formoterol (BREZTRI  AEROSPHERE) 160-9-4.8 MCG/ACT AERO Inhale 2 puffs into the lungs in the morning and at bedtime. with spacer and rinse mouth afterwards. 32.1 g 1   cetirizine (ZYRTEC) 10 MG tablet Take 10 mg by mouth daily as needed for allergies. (Patient not taking: Reported on 08/02/2023)     cholecalciferol (VITAMIN D3) 25 MCG (1000 UNIT) tablet Take 1,000 Units by mouth daily.     cyanocobalamin (VITAMIN B12) 1000 MCG tablet Take by mouth.     fluconazole  (DIFLUCAN ) 150 MG tablet 1 tab po today and 1 tab after completing nystatin  swich (Patient not taking: Reported on 08/02/2023) 2 tablet 0   levonorgestrel  (MIRENA ) 20 MCG/24HR IUD by Intrauterine route.     montelukast  (SINGULAIR ) 10 MG tablet Take 1 tablet (10 mg total) by mouth at bedtime. (Patient not taking: Reported  on 06/15/2023) 90 tablet 2   Multiple Vitamin (MULTIVITAMIN PO) Take 2 tablets by mouth daily at 6 (six) AM. (Patient not taking: Reported on 08/02/2023)     nystatin  (MYCOSTATIN ) 100000 UNIT/ML suspension Take 5 mLs (500,000 Units total) by mouth 4 (four) times daily. 5 ml swish for 30 seconds and then spit out, QID, do not swallow. (Patient not taking: Reported on 08/02/2023) 100 mL 0   omeprazole  (PRILOSEC) 40 MG capsule TAKE 1 CAPSULE (40 MG TOTAL) BY MOUTH DAILY. 90  capsule 1   pyridOXINE (VITAMIN B6) 25 MG tablet Take by mouth.     No current facility-administered medications for this visit.   Allergies: No Known Allergies I reviewed her past medical history, social history, family history, and environmental history and no significant changes have been reported from her previous visit.  Review of Systems  Constitutional:  Negative for appetite change, chills, fever and unexpected weight change.  HENT:  Negative for congestion, rhinorrhea and sneezing.   Eyes:  Negative for itching.  Respiratory:  Negative for cough, chest tightness, shortness of breath and wheezing.   Cardiovascular:  Negative for chest pain.  Gastrointestinal:  Negative for abdominal pain.  Genitourinary:  Negative for difficulty urinating.  Skin:  Negative for rash.  Allergic/Immunologic: Positive for environmental allergies.  Neurological:  Negative for headaches.    Objective: There were no vitals taken for this visit. There is no height or weight on file to calculate BMI. Physical Exam Vitals and nursing note reviewed.  Constitutional:      Appearance: Normal appearance. She is well-developed.  HENT:     Head: Normocephalic and atraumatic.     Right Ear: Tympanic membrane and external ear normal.     Left Ear: Tympanic membrane and external ear normal.     Nose: Nose normal.     Mouth/Throat:     Comments: White patches on pharynx. Eyes:     Conjunctiva/sclera: Conjunctivae normal.  Cardiovascular:     Rate and Rhythm: Normal rate and regular rhythm.     Heart sounds: Normal heart sounds. No murmur heard. Pulmonary:     Effort: Pulmonary effort is normal.     Breath sounds: No wheezing, rhonchi or rales.  Musculoskeletal:     Cervical back: Neck supple.  Skin:    General: Skin is warm.     Findings: No rash.  Neurological:     Mental Status: She is alert and oriented to person, place, and time.  Psychiatric:        Behavior: Behavior normal.     Previous notes and tests were reviewed. The plan was reviewed with the patient/family, and all questions/concerned were addressed.  It was my pleasure to see Tyanah today and participate in her care. Please feel free to contact me with any questions or concerns.  Sincerely,  Eudelia Hero, DO Allergy & Immunology  [facility]  [facility] office: [phone] [facility] office: [phone]

## 2024-01-19 NOTE — Patient Instructions (Addendum)
 Breathing:  Daily controller medication(s):  Breztri  2 puffs twice a day with spacer and rinse mouth afterwards.  Sample and coupon given.  During respiratory infections/asthma flares:  Use albuterol  2 puffs or albuterol  nebulizer twice a day for 1-2 weeks until symptoms subside.  If you need to use your albuterol  nebulizer machine back to back within 15-30 minutes with no relief then please go to the ER/urgent care for further evaluation.  May use albuterol  rescue inhaler 2 puffs or nebulizer every 4 to 6 hours as needed for shortness of breath, chest tightness, coughing, and wheezing. May use albuterol  rescue inhaler 2 puffs 5 to 15 minutes prior to strenuous physical activities. Monitor frequency of use.  Breathing control goals:  Full participation in all desired activities (may need albuterol  before activity) Albuterol  use two times or less a week on average (not counting use with activity) Cough interfering with sleep two times or less a month Oral steroids no more than once a year No hospitalizations   Environmental allergies 2021 skin testing positive to grass pollen.  Continue environmental control measures.  May use over the counter antihistamines such as Zyrtec (cetirizine), Claritin (loratadine), Allegra (fexofenadine), or Xyzal (levocetirizine) daily as needed. May take twice a day if needed. Use Flonase (fluticasone ) nasal spray 1-2 sprays per nostril once a day as needed for nasal congestion.  Nasal saline spray (i.e., Simply Saline) or nasal saline lavage (i.e., NeilMed) is recommended as needed and prior to medicated nasal sprays.  History of polyps Monitor symptoms.   Heartburn: Continue lifestyle and dietary modifications. Omeprazole  40mg  daily in the morning. Nothing to eat or drink for 30 minutes.  Infections Keep track of infections. Get bloodwork to recheck titers when able to.   Oral thrush Take fluconazole  100mg  tablet. Take 2 tablets on day 1. Then take 1  tablets on days 2 to 14. If you notice recurrence please let me know.   Follow up in 6 months or sooner if needed.   Reducing Pollen Exposure Pollen seasons: trees (spring), grass (summer) and ragweed/weeds (fall). Keep windows closed in your home and car to lower pollen exposure.  Install air conditioning in the bedroom and throughout the house if possible.  Avoid going out in dry windy days - especially early morning. Pollen counts are highest between 5 - 10 AM and on dry, hot and windy days.  Save outside activities for late afternoon or after a heavy rain, when pollen levels are lower.  Avoid mowing of grass if you have grass pollen allergy. Be aware that pollen can also be transported indoors on people and pets.  Dry your clothes in an automatic dryer rather than hanging them outside where they might collect pollen.  Rinse hair and eyes before bedtime.

## 2024-01-20 ENCOUNTER — Encounter: Payer: Self-pay | Admitting: Allergy

## 2024-05-09 ENCOUNTER — Encounter: Payer: Self-pay | Admitting: Allergy

## 2024-05-10 MED ORDER — NYSTATIN 100000 UNIT/ML MT SUSP: 5.0000 mL | Freq: Four times a day (QID) | OROMUCOSAL | 0 refills | Status: AC

## 2024-05-26 ENCOUNTER — Other Ambulatory Visit: Payer: Self-pay | Admitting: Allergy

## 2024-05-29 DIAGNOSIS — J45901 Unspecified asthma with (acute) exacerbation: Secondary | ICD-10-CM | POA: Diagnosis not present

## 2024-05-29 DIAGNOSIS — J019 Acute sinusitis, unspecified: Secondary | ICD-10-CM | POA: Diagnosis not present

## 2024-05-29 DIAGNOSIS — B370 Candidal stomatitis: Secondary | ICD-10-CM | POA: Diagnosis not present

## 2024-06-18 ENCOUNTER — Encounter: Payer: BC Managed Care – PPO | Admitting: Family Medicine

## 2024-06-28 ENCOUNTER — Encounter: Payer: Self-pay | Admitting: Family Medicine

## 2024-06-28 ENCOUNTER — Ambulatory Visit (INDEPENDENT_AMBULATORY_CARE_PROVIDER_SITE_OTHER): Admitting: Family Medicine

## 2024-06-28 VITALS — BP 114/76 | HR 79 | Temp 97.9°F | Ht 66.0 in | Wt 155.0 lb

## 2024-06-28 DIAGNOSIS — Z0000 Encounter for general adult medical examination without abnormal findings: Secondary | ICD-10-CM

## 2024-06-28 DIAGNOSIS — Z860101 Personal history of adenomatous and serrated colon polyps: Secondary | ICD-10-CM

## 2024-06-28 DIAGNOSIS — Z13220 Encounter for screening for lipoid disorders: Secondary | ICD-10-CM | POA: Diagnosis not present

## 2024-06-28 DIAGNOSIS — Z131 Encounter for screening for diabetes mellitus: Secondary | ICD-10-CM

## 2024-06-28 DIAGNOSIS — Z23 Encounter for immunization: Secondary | ICD-10-CM | POA: Diagnosis not present

## 2024-06-28 LAB — COMPREHENSIVE METABOLIC PANEL WITH GFR
ALT: 16 U/L (ref 0–35)
AST: 15 U/L (ref 0–37)
Albumin: 4.5 g/dL (ref 3.5–5.2)
Alkaline Phosphatase: 54 U/L (ref 39–117)
BUN: 11 mg/dL (ref 6–23)
CO2: 27 meq/L (ref 19–32)
Calcium: 9.2 mg/dL (ref 8.4–10.5)
Chloride: 104 meq/L (ref 96–112)
Creatinine, Ser: 0.69 mg/dL (ref 0.40–1.20)
GFR: 101.22 mL/min (ref >60.00)
Glucose, Bld: 85 mg/dL (ref 70–99)
Potassium: 4.3 meq/L (ref 3.5–5.1)
Sodium: 139 meq/L (ref 135–145)
Total Bilirubin: 0.5 mg/dL (ref 0.2–1.2)
Total Protein: 6.9 g/dL (ref 6.0–8.3)

## 2024-06-28 LAB — LIPID PANEL
Cholesterol: 202 mg/dL — ABNORMAL HIGH (ref 0–200)
HDL: 53.4 mg/dL (ref >39.00)
LDL Cholesterol: 130 mg/dL — ABNORMAL HIGH (ref 0–99)
NonHDL: 149.08
Total CHOL/HDL Ratio: 4
Triglycerides: 94 mg/dL (ref 0.0–149.0)
VLDL: 18.8 mg/dL (ref 0.0–40.0)

## 2024-06-28 LAB — CBC
HCT: 44.8 % (ref 36.0–46.0)
Hemoglobin: 14.8 g/dL (ref 12.0–15.0)
MCHC: 33 g/dL (ref 30.0–36.0)
MCV: 90.9 fl (ref 78.0–100.0)
Platelets: 328 K/uL (ref 150.0–400.0)
RBC: 4.93 Mil/uL (ref 3.87–5.11)
RDW: 12.9 % (ref 11.5–15.5)
WBC: 11.2 K/uL — ABNORMAL HIGH (ref 4.0–10.5)

## 2024-06-28 LAB — HEMOGLOBIN A1C: Hgb A1c MFr Bld: 5.2 % (ref 4.6–6.5)

## 2024-06-28 LAB — TSH: TSH: 1.44 u[IU]/mL (ref 0.35–5.50)

## 2024-06-28 NOTE — Progress Notes (Signed)
 Patient ID: Lindsay Mays, female  DOB: 1976-03-06, 48 y.o.   MRN: [id number] Patient Care Team    Relationship Specialty Notifications Start End  Catherine Charlies LABOR, DO PCP - General Family Medicine  03/10/21   Nathanael Deatrice Barrio, MD Referring Physician   03/10/21   Burnie Lamar Gardiner PONCE, MD Referring Physician Pediatrics  03/10/21   Carlene Ozell BRAVO., MD Referring Physician Obstetrics and Gynecology  03/10/21   San Sandor GAILS, DO Consulting Physician Gastroenterology  07/27/23   Sheldon Standing, MD Consulting Physician General Surgery  07/27/23     Chief Complaint  Patient presents with   Annual Exam    Pt is fasting.  Influenza vaccine- given  Prevnar vaccine- declined Shingrix vaccine- given Hepatitis B vaccine- declined    Subjective:  Lindsay Mays is a 48 y.o.  Female  present for CPE All past medical history, surgical history, allergies, family history, immunizations, medications and social history were updated in the electronic medical record today. All recent labs, ED visits and hospitalizations within the last year were reviewed.  Health maintenance:  Colonoscopy: FHX colon cancer in father (30)- 07/2021 - 3 yr follow up- Dr. Katharine contact info provided on avs Mammogram: 01/04/2024-Dr. O'Keefe-gyn Cervical cancer screening: last pap: 2021- GYN-requested records for any updated Pap Immunizations: tdap UTD 04/2021, Influenza provided today (encouraged yearly), covid series completed.  Shingrix #1 provided today, Prevnar declined, hepatitis B-declined Infectious disease screening: HIV completed w/ preg., Hep C completed  DEXA: per routine screen Patient has a Dental home. Hospitalizations/ED visits: reviewed      06/28/2024    9:07 AM 06/15/2023    7:52 AM 05/05/2022    8:05 AM 02/12/2022   10:31 AM 05/04/2021    2:42 PM  Depression screen PHQ 2/9  Decreased Interest 0 0 0 0 0  Down, Depressed, Hopeless 0 0 0 0 0  PHQ - 2 Score 0 0 0 0 0  Altered  sleeping 0      Tired, decreased energy 1      Change in appetite 0      Feeling bad or failure about yourself  0      Trouble concentrating 0      Moving slowly or fidgety/restless 0      Suicidal thoughts 0      PHQ-9 Score 1      Difficult doing work/chores Not difficult at all          06/28/2024    9:07 AM  GAD 7 : Generalized Anxiety Score  Nervous, Anxious, on Edge 1  Control/stop worrying 0  Worry too much - different things 0  Trouble relaxing 0  Restless 0  Easily annoyed or irritable 1  Afraid - awful might happen 0  Total GAD 7 Score 2  Anxiety Difficulty Not difficult at all    Immunization History  Administered Date(s) Administered   Influenza Split 09/07/2011, 12/03/2012   Influenza, Seasonal, Injecte, Preservative Fre 06/15/2023, 06/28/2024   Influenza,inj,Quad PF,6+ Mos 05/05/2022   Influenza-Unspecified 06/06/2020   Moderna Covid-19 Fall Seasonal Vaccine 2yrs & older 08/08/2022   Moderna Sars-Covid-2 Vaccination 11/24/2019, 12/25/2019, 08/11/2020   Pneumococcal Polysaccharide-23 08/08/2022   Tdap 05/04/2021   Zoster Recombinant(Shingrix) 06/28/2024    Past Medical History:  Diagnosis Date   Abnormal Papanicolaou smear of cervix    Asthma    on daily meds with rescue   Bronchitis    COVID-19 09/2020   GERD (gastroesophageal reflux disease)  on meds   H/O bilateral breast reduction surgery 08/23/2008   Formatting of this note might be different from the original. Overview:  1997 [place], [place] Formatting of this note might be different from the original. [address] [place], [place]   Hemorrhoids 06/15/2023   History of nasal polypectomy 08/18/2021   Knee pain, bilateral 05/31/2023   Post-COVID chronic cough 12/20/2019   Prolapsed internal hemorrhoids, grade 2 07/27/2023   Pruritus ani 07/27/2023   Recurrent upper respiratory infection (URI)    Seasonal allergies    Thrush, oral 03/18/2023   No Known Allergies Past Surgical History:  Procedure  Laterality Date   BREAST REDUCTION SURGERY  1996   CESAREAN SECTION  2007   WISDOM TOOTH EXTRACTION     Family History  Problem Relation Age of Onset   Early death Father    Colon cancer Father 71       secondary to colitis   Colitis Father    Allergic rhinitis Neg Hx    Angioedema Neg Hx    Asthma Neg Hx    Atopy Neg Hx    Eczema Neg Hx    Immunodeficiency Neg Hx    Urticaria Neg Hx    Esophageal cancer Neg Hx    Stomach cancer Neg Hx    Rectal cancer Neg Hx    Social History   Social History Narrative   Marital status/children/pets: Married.  2 children   Education/employment: Bachelor's degree.  Works as a Transport planner.   Safety:      -smoke alarm in the home:Yes     - wears seatbelt: Yes     - Feels safe in their relationships: Yes       Allergies as of 06/28/2024   No Known Allergies      Medication List        Accurate as of June 28, 2024 11:24 AM. If you have any questions, ask your nurse or doctor.          STOP taking these medications    montelukast  10 MG tablet Commonly known as: Singulair  Stopped by: Charlies Bellini       TAKE these medications    albuterol  (2.5 MG/3ML) 0.083% nebulizer solution Commonly known as: PROVENTIL  Take 3 mLs (2.5 mg total) by nebulization every 4 (four) hours as needed for wheezing or shortness of breath (coughing fits).   ALLERGY RELIEF PO Take by mouth.   Breztri  Aerosphere 160-9-4.8 MCG/ACT Aero inhaler Generic drug: budesonide-glycopyrrolate-formoterol Inhale 2 puffs into the lungs in the morning and at bedtime. with spacer and rinse mouth afterwards.   cholecalciferol 25 MCG (1000 UNIT) tablet Commonly known as: VITAMIN D3 Take 1,000 Units by mouth daily.   cyanocobalamin 1000 MCG tablet Commonly known as: VITAMIN B12 Take by mouth.   fluconazole  100 MG tablet Commonly known as: Diflucan  Take 2 tablets on day 1, then 1 tablet for 13 more days.   levonorgestrel  20 MCG/24HR IUD Commonly  known as: MIRENA  by Intrauterine route.   MULTIVITAMIN PO Take 2 tablets by mouth daily at 6 (six) AM.   nystatin  100000 UNIT/ML suspension Commonly known as: MYCOSTATIN  Take 5 mLs (500,000 Units total) by mouth 4 (four) times daily. Swish and swallow.   omeprazole  40 MG capsule Commonly known as: PRILOSEC TAKE 1 CAPSULE (40 MG TOTAL) BY MOUTH DAILY.   pyridOXINE 25 MG tablet Commonly known as: VITAMIN B6 Take by mouth.        All past medical history, surgical history, allergies, family  history, immunizations andmedications were updated in the EMR today and reviewed under the history and medication portions of their EMR.     No results found for this or any previous visit (from the past 2160 hours).    Review of Systems  All other systems reviewed and are negative.  14 pt review of systems performed and negative (unless mentioned in an HPI)  Objective: BP 114/76   Pulse 79   Temp 97.9 F (36.6 C)   Ht 5' 6 (1.676 m)   Wt 155 lb (70.3 kg)   SpO2 97%   BMI 25.02 kg/m  Physical Exam Vitals and nursing note reviewed.  Constitutional:      General: She is not in acute distress.    Appearance: Normal appearance. She is not ill-appearing or toxic-appearing.  HENT:     Head: Normocephalic and atraumatic.     Right Ear: Tympanic membrane, ear canal and external ear normal. There is no impacted cerumen.     Left Ear: Tympanic membrane, ear canal and external ear normal. There is no impacted cerumen.     Nose: No congestion or rhinorrhea.     Mouth/Throat:     Mouth: Mucous membranes are moist.     Pharynx: Oropharynx is clear. No oropharyngeal exudate or posterior oropharyngeal erythema.  Eyes:     General: No scleral icterus.       Right eye: No discharge.        Left eye: No discharge.     Extraocular Movements: Extraocular movements intact.     Conjunctiva/sclera: Conjunctivae normal.     Pupils: Pupils are equal, round, and reactive to light.   Cardiovascular:     Rate and Rhythm: Normal rate and regular rhythm.     Pulses: Normal pulses.     Heart sounds: Normal heart sounds. No murmur heard.    No friction rub. No gallop.  Pulmonary:     Effort: Pulmonary effort is normal. No respiratory distress.     Breath sounds: Normal breath sounds. No stridor. No wheezing, rhonchi or rales.  Chest:     Chest wall: No tenderness.  Abdominal:     General: Abdomen is flat. Bowel sounds are normal. There is no distension.     Palpations: Abdomen is soft. There is no mass.     Tenderness: There is no abdominal tenderness. There is no right CVA tenderness, left CVA tenderness, guarding or rebound.     Hernia: No hernia is present.  Musculoskeletal:        General: No swelling, tenderness or deformity. Normal range of motion.     Cervical back: Normal range of motion and neck supple. No rigidity or tenderness.     Right lower leg: No edema.     Left lower leg: No edema.  Lymphadenopathy:     Cervical: No cervical adenopathy.  Skin:    General: Skin is warm and dry.     Coloration: Skin is not jaundiced or pale.     Findings: No bruising, erythema, lesion or rash.  Neurological:     General: No focal deficit present.     Mental Status: She is alert and oriented to person, place, and time. Mental status is at baseline.     Cranial Nerves: No cranial nerve deficit.     Sensory: No sensory deficit.     Motor: No weakness.     Coordination: Coordination normal.     Gait: Gait normal.     Deep  Tendon Reflexes: Reflexes normal.  Psychiatric:        Mood and Affect: Mood normal.        Behavior: Behavior normal.        Thought Content: Thought content normal.        Judgment: Judgment normal.     No results found.  Assessment/plan: Lindsay Mays is a 48 y.o. female present for CPE  Influenza vaccine needed - Flu vaccine trivalent PF, 6mos and older(Flulaval,Afluria,Fluarix,Fluzone) History of adenomatous polyp of colon Due for  colonoscopy repeat in November, GI contact information placed on her AVS Lipid screening - Lipid panel Diabetes mellitus screening - Hemoglobin A1c Need for vaccination for pneumococcus Declined Need for zoster vaccination - Varicella-zoster vaccine IM #1 -Nurse visit 2-6 months for Shingrix No. 2 to complete series Need for hepatitis vaccination Declined  Routine general medical examination at a health care facility (Primary) Patient was encouraged to exercise greater than 150 minutes a week. Patient was encouraged to choose a diet filled with fresh fruits and vegetables, and lean meats. AVS provided to patient today for education/recommendation on gender specific health and safety maintenance. Colonoscopy: FHX colon cancer in father (30)- 07/2021 - 3 yr follow up- Dr. Katharine contact info provided on avs Mammogram: 01/04/2024-Dr. O'Keefe-gyn Cervical cancer screening: last pap: 2021- GYN-requested records for any updated Pap Immunizations: tdap UTD 04/2021, Influenza provided today (encouraged yearly), covid series completed.  Shingrix #1 provided today, Prevnar declined, hepatitis B-declined Infectious disease screening: HIV completed w/ preg., Hep C completed  DEXA: per routine screen  Return in about 1 year (around 06/29/2025) for cpe (20 min). Nurse visit in 3-6 mos. Shingrix #2  Orders Placed This Encounter  Procedures   Flu vaccine trivalent PF, 6mos and older(Flulaval,Afluria,Fluarix,Fluzone)   Varicella-zoster vaccine IM   CBC   Comprehensive metabolic panel with GFR   Hemoglobin A1c   Lipid panel   TSH    No orders of the defined types were placed in this encounter.  Referral Orders  No referral(s) requested today     Electronically signed by: Charlies Bellini, DO  [facility]

## 2024-06-28 NOTE — Patient Instructions (Addendum)
 Return in about 1 year (around 06/29/2025) for cpe (20 min).  Nurse visit in 3-6 mos. Shingrix #2  You are due for repeat colonoscopy this November. If you have not heard from GI team to schedule, please call them  [facility] Address:  [address] [place], [place], [place] [address] Phone: [phone]       Great to see you today.  I have refilled the medication(s) we provide.   If labs were collected or images ordered, we will inform you of  results once we have received them and reviewed. We will contact you either by echart message, or telephone call.  Please give ample time to the testing facility, and our office to run,  receive and review results. Please do not call inquiring of results, even if you can see them in your chart. We will contact you as soon as we are able. If it has been over 1 week since the test was completed, and you have not yet heard from us , then please call us .    - echart message- for normal results that have been seen by the patient already.   - telephone call: abnormal results or if patient has not viewed results in their echart.  If a referral to a specialist was entered for you, please call us  in 2 weeks if you have not heard from the specialist office to schedule.

## 2024-06-29 ENCOUNTER — Ambulatory Visit: Payer: Self-pay | Admitting: Family Medicine

## 2024-07-14 DIAGNOSIS — J3489 Other specified disorders of nose and nasal sinuses: Secondary | ICD-10-CM | POA: Diagnosis not present

## 2024-07-14 DIAGNOSIS — J209 Acute bronchitis, unspecified: Secondary | ICD-10-CM | POA: Diagnosis not present

## 2024-07-14 DIAGNOSIS — J019 Acute sinusitis, unspecified: Secondary | ICD-10-CM | POA: Diagnosis not present

## 2024-07-19 ENCOUNTER — Ambulatory Visit: Admitting: Allergy

## 2024-07-19 ENCOUNTER — Encounter: Payer: Self-pay | Admitting: Allergy

## 2024-07-19 ENCOUNTER — Other Ambulatory Visit: Payer: Self-pay

## 2024-07-19 VITALS — BP 110/74 | HR 70 | Temp 98.0°F | Resp 16 | Ht 65.0 in | Wt 153.5 lb

## 2024-07-19 DIAGNOSIS — J4540 Moderate persistent asthma, uncomplicated: Secondary | ICD-10-CM | POA: Diagnosis not present

## 2024-07-19 DIAGNOSIS — B999 Unspecified infectious disease: Secondary | ICD-10-CM | POA: Diagnosis not present

## 2024-07-19 DIAGNOSIS — J301 Allergic rhinitis due to pollen: Secondary | ICD-10-CM | POA: Diagnosis not present

## 2024-07-19 DIAGNOSIS — K219 Gastro-esophageal reflux disease without esophagitis: Secondary | ICD-10-CM | POA: Diagnosis not present

## 2024-07-19 DIAGNOSIS — Z9889 Other specified postprocedural states: Secondary | ICD-10-CM

## 2024-07-19 DIAGNOSIS — Z8709 Personal history of other diseases of the respiratory system: Secondary | ICD-10-CM

## 2024-07-19 MED ORDER — BREZTRI AEROSPHERE 160-9-4.8 MCG/ACT IN AERO: 2.0000 | INHALATION_SPRAY | Freq: Two times a day (BID) | RESPIRATORY_TRACT | 3 refills | Status: AC

## 2024-07-19 MED ORDER — OMEPRAZOLE 40 MG PO CPDR: 40.0000 mg | DELAYED_RELEASE_CAPSULE | Freq: Every day | ORAL | 3 refills | Status: AC

## 2024-07-19 NOTE — Patient Instructions (Addendum)
 Breathing:  Daily controller medication(s):  Breztri  2 puffs twice a day with spacer and rinse mouth afterwards.  During respiratory infections/asthma flares:  Use albuterol  2 puffs or albuterol  nebulizer twice a day for 1-2 weeks until symptoms subside.  If you need to use your albuterol  nebulizer machine back to back within 15-30 minutes with no relief then please go to the ER/urgent care for further evaluation.  May use albuterol  rescue inhaler 2 puffs or nebulizer every 4 to 6 hours as needed for shortness of breath, chest tightness, coughing, and wheezing. May use albuterol  rescue inhaler 2 puffs 5 to 15 minutes prior to strenuous physical activities. Monitor frequency of use.  Breathing control goals:  Full participation in all desired activities (may need albuterol  before activity) Albuterol  use two times or less a week on average (not counting use with activity) Cough interfering with sleep two times or less a month Oral steroids no more than once a year No hospitalizations   Environmental allergies 2021 skin testing positive to grass pollen.  Continue environmental control measures.  May use over the counter antihistamines such as Zyrtec (cetirizine), Claritin (loratadine), Allegra (fexofenadine), or Xyzal (levocetirizine) daily as needed. May take twice a day if needed. Use Flonase (fluticasone ) nasal spray 1-2 sprays per nostril once a day as needed for nasal congestion.  Nasal saline spray (i.e., Simply Saline) or nasal saline lavage (i.e., NeilMed) is recommended as needed and prior to medicated nasal sprays.  History of polyps Monitor symptoms.  If you feel like your are more congested than normal then see ENT to look if polyps are back. If yes, consider starting Tezspire injections every 4 weeks. This is approved for polyps and asthma.   Heartburn Continue lifestyle and dietary modifications. Omeprazole  40mg  daily in the morning. Nothing to eat or drink for 30  minutes.  Infections Keep track of infections. Get bloodwork.  Follow up in 6 months or sooner if needed.   Reducing Pollen Exposure Pollen seasons: trees (spring), grass (summer) and ragweed/weeds (fall). Keep windows closed in your home and car to lower pollen exposure.  Install air conditioning in the bedroom and throughout the house if possible.  Avoid going out in dry windy days - especially early morning. Pollen counts are highest between 5 - 10 AM and on dry, hot and windy days.  Save outside activities for late afternoon or after a heavy rain, when pollen levels are lower.  Avoid mowing of grass if you have grass pollen allergy. Be aware that pollen can also be transported indoors on people and pets.  Dry your clothes in an automatic dryer rather than hanging them outside where they might collect pollen.  Rinse hair and eyes before bedtime.

## 2024-07-19 NOTE — Progress Notes (Signed)
 Follow Up Note  RE: Lindsay Mays MRN: [id number] DOB: 1975/11/20 Date of Office Visit: 07/19/2024  Referring provider: Catherine Charlies LABOR, DO Primary care provider: Catherine Charlies LABOR, DO  Chief Complaint: Follow-up (She is on treatment of bronchitis by pednisone and antibiotic since last Saturday.)  History of Present Illness: I had the pleasure of seeing Lindsay Mays for a follow up visit at the [facility] on 07/19/2024. She is a 48 y.o. female, who is being followed for asthma, allergic rhinitis, history of nasal polypectomy, recurrent infections and GERD. Her previous allergy office visit was on 01/19/2024 with Dr. Luke. Today is a regular follow up visit.  Discussed the use of AI scribe software for clinical note transcription with the patient, who gave verbal consent to proceed.    She experienced increased wheezing after returning from a plane trip last week. She visited urgent care and was diagnosed with acute bronchitis. She was prescribed a 10-day course of prednisone  and an antibiotic which she has been taking three times a day since Sunday. She notes improvement in her symptoms since starting the medication.  She has a history of sinus infections and sinus polyps, which were previously removed. She experiences sinus infections predominantly on one side, often feeling it in her teeth. She takes Flonase in the morning and an over-the-counter allergy pill at night to manage her symptoms.  Her asthma management includes using Breztri , two puffs twice a day, which she finds effective in controlling her symptoms. She has not needed to use her albuterol  inhaler frequently, only once over the summer. She has not used her nebulizer recently. Her chronic cough has improved significantly since resuming Breztri  after a brief discontinuation.  She takes omeprazole  40 mg for heartburn, which alleviates the sensation of having something stuck in her throat. She has not experienced  thrush recently, attributing this to using a spacer and gargling after inhaler use.  In 2022, she was on prednisone  five times, but this year she has only needed it once.     Assessment and Plan: Lindsay Mays is a 48 y.o. female with: Asthma Past history - Diagnosed with COVID-19 in January 2021 and developed a persistent cough afterwards.  Evaluated by pulmonology and diagnosed with asthma. Up-to-date with COVID-19 vaccination. Covid-19 in July 2022. 5 courses of prednisone  in 2022. CT chest showed nodular densities. Eos 100, Ige 116. Dislikes Trelegy. Saw pulm - no need for repeat imaging. Unable to wean off Breztri .  Interim history - stable with Breztri . Recent bronchitis and sinus infection treated with prednisone  and antibiotics. Daily controller medication(s):  Breztri  2 puffs twice a day with spacer and rinse mouth afterwards.  During respiratory infections/asthma flares:  Use albuterol  2 puffs or albuterol  nebulizer twice a day for 1-2 weeks until symptoms subside.  If you need to use your albuterol  nebulizer machine back to back within 15-30 minutes with no relief then please go to the ER/urgent care for further evaluation.  May use albuterol  rescue inhaler 2 puffs or nebulizer every 4 to 6 hours as needed for shortness of breath, chest tightness, coughing, and wheezing. May use albuterol  rescue inhaler 2 puffs 5 to 15 minutes prior to strenuous physical activities. Monitor frequency of use.  Get spirometry at next visit.   Seasonal allergic rhinitis due to pollen Past history - Rhinoconjunctivitis symptoms for many years mainly in the spring and fall but now worsening since May.  2021 bloodwork was negative. 2021 skin testing showed: Positive  to grass pollen only. S/p polypectomy and deviated septum repair in 2022. 2023 bloodwork negative to environmental allergy panel.  Interim history - nasal congestion. Continue environmental control measures.  May use over the counter antihistamines such  as Zyrtec (cetirizine), Claritin (loratadine), Allegra (fexofenadine), or Xyzal (levocetirizine) daily as needed. May take twice a day if needed. Use Flonase (fluticasone ) nasal spray 1-2 sprays per nostril once a day as needed for nasal congestion.  Nasal saline spray (i.e., Simply Saline) or nasal saline lavage (i.e., NeilMed) is recommended as needed and prior to medicated nasal sprays.   History of nasal polypectomy Past history - polypectomy in January 2022. Interim history - some increased congestion noted. If you feel like your are more congested than normal then see ENT to look if polyps are back. If yes, consider starting Tezspire injections every 4 weeks. This is approved for polyps and asthma.    Gastroesophageal reflux disease Past history - Globus sensation resolved with omeprazole . Famotidine ineffective.  Interim history - stable.  Continue lifestyle and dietary modifications. Omeprazole  40mg  daily in the morning. Nothing to eat or drink for 30 minutes. If doing well will step down therapy to 20mg  at next visit.   Recurrent infections Past history - Normal immunoglobulin levels, low pneumococcal titers.  Interim history - did not get post pneumonia vaccine titers yet but noticed less infections since the pneumonia vaccine. Keep track of infections. Get bloodwork.  Return in about 6 months (around 01/16/2025).  Meds ordered this encounter  Medications   omeprazole  (PRILOSEC) 40 MG capsule    Sig: Take 1 capsule (40 mg total) by mouth daily.    Dispense:  90 capsule    Refill:  3   budesonide-glycopyrrolate-formoterol (BREZTRI  AEROSPHERE) 160-9-4.8 MCG/ACT AERO inhaler    Sig: Inhale 2 puffs into the lungs in the morning and at bedtime. with spacer and rinse mouth afterwards.    Dispense:  32.1 g    Refill:  3    3 month supply at a time   Lab Orders         Strep pneumoniae 23 Serotypes IgG         IgG, IgA, IgM      Diagnostics: None.    Medication List:   Current Outpatient Medications  Medication Sig Dispense Refill   Chlorpheniramine Maleate (ALLERGY RELIEF PO) Take by mouth.     cholecalciferol (VITAMIN D3) 25 MCG (1000 UNIT) tablet Take 1,000 Units by mouth daily.     cyanocobalamin (VITAMIN B12) 1000 MCG tablet Take by mouth.     fluconazole  (DIFLUCAN ) 100 MG tablet Take 2 tablets on day 1, then 1 tablet for 13 more days. 15 tablet 0   fluticasone  (FLONASE) 50 MCG/ACT nasal spray Place into both nostrils daily.     levonorgestrel  (MIRENA ) 20 MCG/24HR IUD by Intrauterine route.     Multiple Vitamin (MULTIVITAMIN PO) Take 2 tablets by mouth daily at 6 (six) AM.     nystatin  (MYCOSTATIN ) 100000 UNIT/ML suspension Take 5 mLs (500,000 Units total) by mouth 4 (four) times daily. Swish and swallow. 60 mL 0   pyridOXINE (VITAMIN B6) 25 MG tablet Take by mouth.     albuterol  (PROVENTIL ) (2.5 MG/3ML) 0.083% nebulizer solution Take 3 mLs (2.5 mg total) by nebulization every 4 (four) hours as needed for wheezing or shortness of breath (coughing fits). (Patient not taking: Reported on 07/19/2024) 75 mL 2   budesonide-glycopyrrolate-formoterol (BREZTRI  AEROSPHERE) 160-9-4.8 MCG/ACT AERO inhaler Inhale 2 puffs into the lungs  in the morning and at bedtime. with spacer and rinse mouth afterwards. 32.1 g 3   omeprazole  (PRILOSEC) 40 MG capsule Take 1 capsule (40 mg total) by mouth daily. 90 capsule 3   No current facility-administered medications for this visit.   Allergies: No Known Allergies I reviewed her past medical history, social history, family history, and environmental history and no significant changes have been reported from her previous visit.  Review of Systems  Constitutional:  Negative for appetite change, chills, fever and unexpected weight change.  HENT:  Positive for congestion. Negative for rhinorrhea and sneezing.   Eyes:  Negative for itching.  Respiratory:  Negative for cough, chest tightness, shortness of breath and wheezing.    Cardiovascular:  Negative for chest pain.  Gastrointestinal:  Negative for abdominal pain.  Genitourinary:  Negative for difficulty urinating.  Skin:  Negative for rash.  Allergic/Immunologic: Positive for environmental allergies.  Neurological:  Negative for headaches.    Objective: BP 110/74 (BP Location: Left Arm, Patient Position: Sitting, Cuff Size: Normal)   Pulse 70   Temp 98 F (36.7 C) (Temporal)   Resp 16   Ht 5' 5 (1.651 m)   Wt 153 lb 8 oz (69.6 kg)   SpO2 95%   BMI 25.54 kg/m  Body mass index is 25.54 kg/m. Physical Exam Vitals and nursing note reviewed.  Constitutional:      Appearance: Normal appearance. She is well-developed.  HENT:     Head: Normocephalic and atraumatic.     Right Ear: Tympanic membrane and external ear normal.     Left Ear: Tympanic membrane and external ear normal.     Nose: Nose normal.     Mouth/Throat:     Mouth: Mucous membranes are moist.     Pharynx: Oropharynx is clear.  Eyes:     Conjunctiva/sclera: Conjunctivae normal.  Cardiovascular:     Rate and Rhythm: Normal rate and regular rhythm.     Heart sounds: Normal heart sounds. No murmur heard. Pulmonary:     Effort: Pulmonary effort is normal.     Breath sounds: No wheezing, rhonchi or rales.  Musculoskeletal:     Cervical back: Neck supple.  Skin:    General: Skin is warm.     Findings: No rash.  Neurological:     Mental Status: She is alert and oriented to person, place, and time.  Psychiatric:        Behavior: Behavior normal.    Previous notes and tests were reviewed. The plan was reviewed with the patient/family, and all questions/concerned were addressed.  It was my pleasure to see Lindsay Mays today and participate in her care. Please feel free to contact me with any questions or concerns.  Sincerely,  Orlan Cramp, DO Allergy & Immunology  [facility]  [facility] office: [phone] [facility] office: [phone]

## 2024-08-08 DIAGNOSIS — H40003 Preglaucoma, unspecified, bilateral: Secondary | ICD-10-CM | POA: Diagnosis not present

## 2024-11-02 ENCOUNTER — Ambulatory Visit
